# Patient Record
Sex: Female | Born: 1950
Health system: Southern US, Community
[De-identification: ages and names within clinical notes are randomized; demographics above are authoritative.]

## PROBLEM LIST (undated history)

## (undated) DIAGNOSIS — E7800 Pure hypercholesterolemia, unspecified: Secondary | ICD-10-CM

## (undated) DIAGNOSIS — G4730 Sleep apnea, unspecified: Secondary | ICD-10-CM

## (undated) DIAGNOSIS — R1310 Dysphagia, unspecified: Secondary | ICD-10-CM

## (undated) DIAGNOSIS — J309 Allergic rhinitis, unspecified: Secondary | ICD-10-CM

## (undated) DIAGNOSIS — K219 Gastro-esophageal reflux disease without esophagitis: Secondary | ICD-10-CM

## (undated) DIAGNOSIS — D649 Anemia, unspecified: Secondary | ICD-10-CM

## (undated) DIAGNOSIS — M1990 Unspecified osteoarthritis, unspecified site: Secondary | ICD-10-CM

## (undated) DIAGNOSIS — T7840XA Allergy, unspecified, initial encounter: Secondary | ICD-10-CM

## (undated) DIAGNOSIS — H409 Unspecified glaucoma: Secondary | ICD-10-CM

## (undated) DIAGNOSIS — G4700 Insomnia, unspecified: Secondary | ICD-10-CM

## (undated) DIAGNOSIS — H269 Unspecified cataract: Secondary | ICD-10-CM

## (undated) DIAGNOSIS — K589 Irritable bowel syndrome without diarrhea: Secondary | ICD-10-CM

## (undated) DIAGNOSIS — R1013 Epigastric pain: Secondary | ICD-10-CM

## (undated) DIAGNOSIS — R140 Abdominal distension (gaseous): Secondary | ICD-10-CM

## (undated) DIAGNOSIS — G43909 Migraine, unspecified, not intractable, without status migrainosus: Secondary | ICD-10-CM

## (undated) DIAGNOSIS — Z860100 Personal history of colon polyps, unspecified: Secondary | ICD-10-CM

## (undated) DIAGNOSIS — E039 Hypothyroidism, unspecified: Secondary | ICD-10-CM

## (undated) DIAGNOSIS — N182 Chronic kidney disease, stage 2 (mild): Secondary | ICD-10-CM

## (undated) DIAGNOSIS — R634 Abnormal weight loss: Secondary | ICD-10-CM

## (undated) HISTORY — DX: Migraine, unspecified, not intractable, without status migrainosus: G43.909

## (undated) HISTORY — PX: UPPER GASTROINTESTINAL ENDOSCOPY: SHX188

## (undated) HISTORY — DX: Irritable bowel syndrome, unspecified: K58.9

## (undated) HISTORY — DX: Sleep apnea, unspecified: G47.30

## (undated) HISTORY — DX: Chronic kidney disease, stage 2 (mild): N18.2

## (undated) HISTORY — DX: Allergy, unspecified, initial encounter: T78.40XA

## (undated) HISTORY — DX: Anemia, unspecified: D64.9

## (undated) HISTORY — DX: Unspecified osteoarthritis, unspecified site: M19.90

## (undated) HISTORY — DX: Gastro-esophageal reflux disease without esophagitis: K21.9

## (undated) HISTORY — DX: Hypothyroidism, unspecified: E03.9

## (undated) HISTORY — DX: Abdominal distension (gaseous): R14.0

## (undated) HISTORY — PX: CHOLECYSTECTOMY: SHX55

## (undated) HISTORY — DX: Personal history of colon polyps, unspecified: Z86.0100

## (undated) HISTORY — DX: Abnormal weight loss: R63.4

## (undated) HISTORY — DX: Insomnia, unspecified: G47.00

## (undated) HISTORY — PX: POLYPECTOMY: SHX149

## (undated) HISTORY — DX: Unspecified cataract: H26.9

## (undated) HISTORY — DX: Unspecified glaucoma: H40.9

## (undated) HISTORY — DX: Epigastric pain: R10.13

## (undated) HISTORY — DX: Personal history of colonic polyps: Z86.010

## (undated) HISTORY — DX: Allergic rhinitis, unspecified: J30.9

## (undated) HISTORY — DX: Dysphagia, unspecified: R13.10

## (undated) HISTORY — PX: HUMERUS FRACTURE SURGERY: SHX670

## (undated) HISTORY — PX: NASAL SINUS SURGERY: SHX719

## (undated) HISTORY — DX: Pure hypercholesterolemia, unspecified: E78.00

---

## 1990-01-10 HISTORY — PX: CHOLECYSTECTOMY, LAPAROSCOPIC: SHX56

## 1998-05-14 ENCOUNTER — Ambulatory Visit (HOSPITAL_COMMUNITY): Admission: RE | Admit: 1998-05-14 | Discharge: 1998-05-14 | Payer: Self-pay | Admitting: *Deleted

## 1998-05-14 ENCOUNTER — Encounter: Payer: Self-pay | Admitting: *Deleted

## 1998-08-11 HISTORY — PX: TRANSPHENOIDAL PITUITARY RESECTION: SHX2572

## 1999-08-11 ENCOUNTER — Encounter: Payer: Self-pay | Admitting: Unknown Physician Specialty

## 1999-08-11 ENCOUNTER — Ambulatory Visit (HOSPITAL_COMMUNITY): Admission: RE | Admit: 1999-08-11 | Discharge: 1999-08-11 | Payer: Self-pay | Admitting: Unknown Physician Specialty

## 1999-11-11 HISTORY — PX: TMJ ARTHROPLASTY: SHX1066

## 2000-04-03 ENCOUNTER — Ambulatory Visit (HOSPITAL_COMMUNITY): Admission: RE | Admit: 2000-04-03 | Discharge: 2000-04-03 | Payer: Self-pay | Admitting: Neurology

## 2000-04-03 ENCOUNTER — Encounter: Payer: Self-pay | Admitting: Neurology

## 2004-06-25 ENCOUNTER — Ambulatory Visit (HOSPITAL_COMMUNITY): Admission: RE | Admit: 2004-06-25 | Discharge: 2004-06-25 | Payer: Self-pay | Admitting: *Deleted

## 2008-03-20 ENCOUNTER — Ambulatory Visit (HOSPITAL_COMMUNITY): Admission: RE | Admit: 2008-03-20 | Discharge: 2008-03-20 | Payer: Self-pay | Admitting: Psychiatry

## 2010-01-20 HISTORY — PX: COLONOSCOPY: SHX174

## 2010-02-22 ENCOUNTER — Ambulatory Visit (HOSPITAL_COMMUNITY)
Admission: RE | Admit: 2010-02-22 | Discharge: 2010-02-22 | Disposition: A | Discharge status: Home / Self Care | Payer: BC Managed Care – PPO | Source: Ambulatory Visit | Attending: Orthopedic Surgery | Admitting: Orthopedic Surgery

## 2010-02-22 ENCOUNTER — Other Ambulatory Visit (HOSPITAL_COMMUNITY): Payer: Self-pay | Admitting: Orthopedic Surgery

## 2010-02-22 ENCOUNTER — Other Ambulatory Visit (HOSPITAL_COMMUNITY): Payer: Self-pay

## 2010-02-22 ENCOUNTER — Encounter (HOSPITAL_COMMUNITY)
Admission: RE | Admit: 2010-02-22 | Discharge: 2010-02-22 | Disposition: A | Discharge status: Home / Self Care | Payer: BC Managed Care – PPO | Source: Ambulatory Visit | Attending: Orthopedic Surgery | Admitting: Orthopedic Surgery

## 2010-02-22 DIAGNOSIS — X58XXXA Exposure to other specified factors, initial encounter: Secondary | ICD-10-CM | POA: Insufficient documentation

## 2010-02-22 DIAGNOSIS — Z01818 Encounter for other preprocedural examination: Secondary | ICD-10-CM | POA: Insufficient documentation

## 2010-02-22 DIAGNOSIS — S42213A Unspecified displaced fracture of surgical neck of unspecified humerus, initial encounter for closed fracture: Secondary | ICD-10-CM

## 2010-02-22 DIAGNOSIS — S42309A Unspecified fracture of shaft of humerus, unspecified arm, initial encounter for closed fracture: Secondary | ICD-10-CM | POA: Insufficient documentation

## 2010-02-22 LAB — SURGICAL PCR SCREEN: Staphylococcus aureus: POSITIVE — AB

## 2010-02-22 LAB — COMPREHENSIVE METABOLIC PANEL
AST: 42 U/L — ABNORMAL HIGH (ref 0–37)
Albumin: 4 g/dL (ref 3.5–5.2)
Alkaline Phosphatase: 98 U/L (ref 39–117)
CO2: 32 mEq/L (ref 19–32)
Chloride: 99 mEq/L (ref 96–112)
Creatinine, Ser: 0.69 mg/dL (ref 0.4–1.2)
GFR calc Af Amer: >60 mL/min (ref >60)
GFR calc non Af Amer: >60 mL/min (ref >60)
Potassium: 4 mEq/L (ref 3.5–5.1)
Total Bilirubin: 0.3 mg/dL (ref 0.3–1.2)

## 2010-02-22 LAB — PROTIME-INR
INR: 0.88 (ref 0.00–1.49)
Prothrombin Time: 12.1 seconds (ref 11.6–15.2)

## 2010-02-22 LAB — CBC
Hemoglobin: 12.4 g/dL (ref 12.0–15.0)
Platelets: 312 10*3/uL (ref 150–400)
RBC: 4.18 MIL/uL (ref 3.87–5.11)

## 2010-02-22 LAB — URINE MICROSCOPIC-ADD ON

## 2010-02-22 LAB — ABO/RH: ABO/RH(D): A NEG

## 2010-02-22 LAB — URINALYSIS, ROUTINE W REFLEX MICROSCOPIC
Hgb urine dipstick: NEGATIVE
Ketones, ur: NEGATIVE mg/dL
Protein, ur: NEGATIVE mg/dL
Urobilinogen, UA: 0.2 mg/dL (ref 0.0–1.0)

## 2010-02-22 LAB — TYPE AND SCREEN: Antibody Screen: NEGATIVE

## 2010-02-22 LAB — TSH: TSH: 2.228 u[IU]/mL (ref 0.350–4.500)

## 2010-02-22 LAB — DIFFERENTIAL
Basophils Absolute: 0 10*3/uL (ref 0.0–0.1)
Basophils Relative: 0 % (ref 0–1)
Eosinophils Absolute: 0.2 10*3/uL (ref 0.0–0.7)
Neutro Abs: 4.4 10*3/uL (ref 1.7–7.7)
Neutrophils Relative %: 55 % (ref 43–77)

## 2010-02-23 ENCOUNTER — Inpatient Hospital Stay (HOSPITAL_COMMUNITY): Payer: BC Managed Care – PPO

## 2010-02-23 ENCOUNTER — Ambulatory Visit (HOSPITAL_COMMUNITY)
Admission: RE | Admit: 2010-02-23 | Discharge: 2010-02-26 | Disposition: A | Discharge status: Home / Self Care | Payer: BC Managed Care – PPO | Source: Ambulatory Visit | Attending: Orthopedic Surgery | Admitting: Orthopedic Surgery

## 2010-02-23 DIAGNOSIS — E039 Hypothyroidism, unspecified: Secondary | ICD-10-CM | POA: Insufficient documentation

## 2010-02-23 DIAGNOSIS — Z01812 Encounter for preprocedural laboratory examination: Secondary | ICD-10-CM | POA: Insufficient documentation

## 2010-02-23 DIAGNOSIS — Z01810 Encounter for preprocedural cardiovascular examination: Secondary | ICD-10-CM | POA: Insufficient documentation

## 2010-02-23 DIAGNOSIS — Y998 Other external cause status: Secondary | ICD-10-CM | POA: Insufficient documentation

## 2010-02-23 DIAGNOSIS — M129 Arthropathy, unspecified: Secondary | ICD-10-CM | POA: Insufficient documentation

## 2010-02-23 DIAGNOSIS — S42209A Unspecified fracture of upper end of unspecified humerus, initial encounter for closed fracture: Secondary | ICD-10-CM | POA: Insufficient documentation

## 2010-02-23 DIAGNOSIS — Y9229 Other specified public building as the place of occurrence of the external cause: Secondary | ICD-10-CM | POA: Insufficient documentation

## 2010-02-23 DIAGNOSIS — IMO0002 Reserved for concepts with insufficient information to code with codable children: Secondary | ICD-10-CM | POA: Insufficient documentation

## 2010-02-23 DIAGNOSIS — Z23 Encounter for immunization: Secondary | ICD-10-CM | POA: Insufficient documentation

## 2010-02-23 DIAGNOSIS — D649 Anemia, unspecified: Secondary | ICD-10-CM | POA: Insufficient documentation

## 2010-02-23 LAB — VITAMIN D 25 HYDROXY (VIT D DEFICIENCY, FRACTURES): Vit D, 25-Hydroxy: 48 ng/mL (ref 30–89)

## 2010-02-23 LAB — CALCIUM, IONIZED: Calcium, Ion: 1.31 mmol/L (ref 1.12–1.32)

## 2010-02-24 ENCOUNTER — Inpatient Hospital Stay (HOSPITAL_COMMUNITY): Payer: BC Managed Care – PPO

## 2010-02-24 LAB — CBC
HCT: 33.3 % — ABNORMAL LOW (ref 36.0–46.0)
Hemoglobin: 11.3 g/dL — ABNORMAL LOW (ref 12.0–15.0)
MCH: 29.6 pg (ref 26.0–34.0)
MCHC: 33.9 g/dL (ref 30.0–36.0)
MCV: 87.2 fL (ref 78.0–100.0)
RDW: 14 % (ref 11.5–15.5)

## 2010-02-24 LAB — BASIC METABOLIC PANEL
BUN: 9 mg/dL (ref 6–23)
CO2: 28 mEq/L (ref 19–32)
Calcium: 9 mg/dL (ref 8.4–10.5)
Creatinine, Ser: 0.72 mg/dL (ref 0.4–1.2)
GFR calc non Af Amer: >60 mL/min (ref >60)
Glucose, Bld: 141 mg/dL — ABNORMAL HIGH (ref 70–99)
Sodium: 137 mEq/L (ref 135–145)

## 2010-02-25 ENCOUNTER — Other Ambulatory Visit (HOSPITAL_COMMUNITY): Payer: Self-pay

## 2010-02-26 LAB — MISCELLANEOUS TEST
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
## 2010-03-11 NOTE — Discharge Summary (Signed)
  NAME:  Robin Hunter, Robin Hunter               ACCOUNT NO.:  [id number]  MEDICAL RECORD NO.:  [id number]           PATIENT TYPE:  I  LOCATION:  5030                         FACILITY:  [facility]  PHYSICIAN:  Doralee Albino. Carola Frost, M.D. DATE OF BIRTH:  1950-03-04  DATE OF ADMISSION:  02/23/2010 DATE OF DISCHARGE:                              DISCHARGE SUMMARY   ADDENDUM  This is an addendum to job number [id number].  Please see previous discharge summary for full summary.  We did anticipate discharge on February 25, 2010; however, I was somewhat concerned with the patient's high blood pressure as well as a mildly elevated heart rate despite her being completely comfortable.  We did keep her overnight for continued observation.  The patient did not have any apparent issues overnight and continued to remain stable following morning.  DISCHARGE VITAL SIGNS:  On February 26, 2010, temperature 98.3, heart rate 112, BP 144/82, respirations 18 at 97% on room air.  The patient denied any chest pain.  No shortness of breath.  No palpitations.  No visual changes.  The patient does report recent dealings with headaches for which [facility] [place].  She thinks that these are really not like any migraine headaches that she has in the past.  I do feel that these may be related to some undiagnosed hypertension.  I did review the patient's perioperative record again, which showed elevated heart rate as well as elevated blood pressures with systolic BP from 324M-010U and diastolic ranging from 70s-90s.  Heart rate also fairly sustained in the high 90s to low 100s.  Again, the patient does appear to be asymptomatic in terms of any type of cardiac ischemia.  She does not have any chest pain or palpitations.  She is likely dealing this for some time and I feel that she would be best evaluated by her PCP for possible treatment for her hypertension, which may include some beta-blockers, and this may  address her headaches as well.  From an Orthopedic standpoint, she is stable for discharge to home with followup with [place] in 10-14 days, and I would like her to follow up with her primary care physician within 1-2 weeks. Again, please see previous note for full summary.  No changes to medications will be made as well.     Mearl Latin, PA   ______________________________ Doralee Albino. Carola Frost, M.D.   KWP/MEDQ  D:  02/26/2010  T:  02/26/2010  Job:  [id number]  cc:   Dr. Welton Flakes  Electronically Signed by Montez Morita PA on 02/26/2010 12:08:59 PM Electronically Signed by Myrene Galas M.D. on 03/11/2010 07:53:52 AM

## 2011-05-14 IMAGING — RF DG HUMERUS 2V *L*
1 series · 8 of 8 positions shown · non-contrast
Comparison: 02/22/2010 chest radiograph

CLINICAL DATA: Internal fixation of left humeral fracture.

LEFT HUMERUS - 2+ VIEW

[Series 1: run · 8 of 8 slices shown]
[im 1/8]
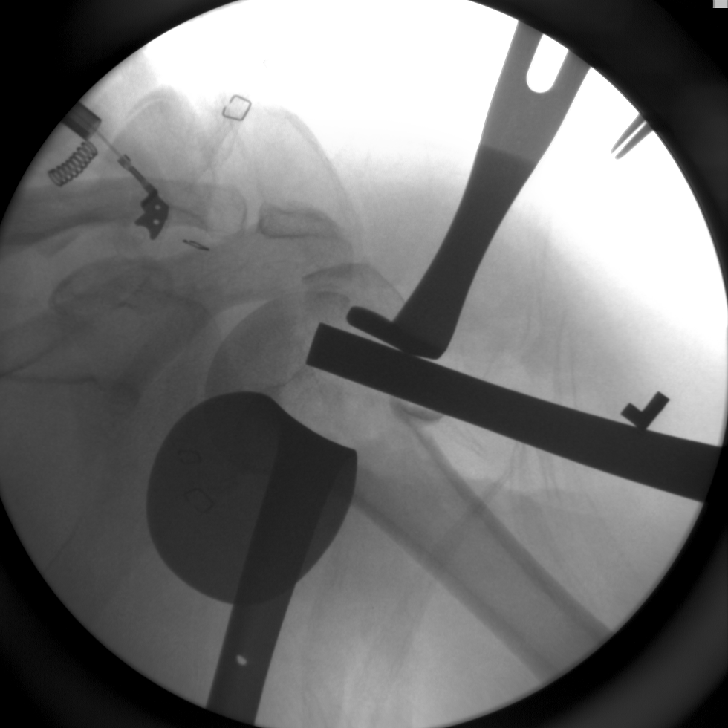
[im 2/8]
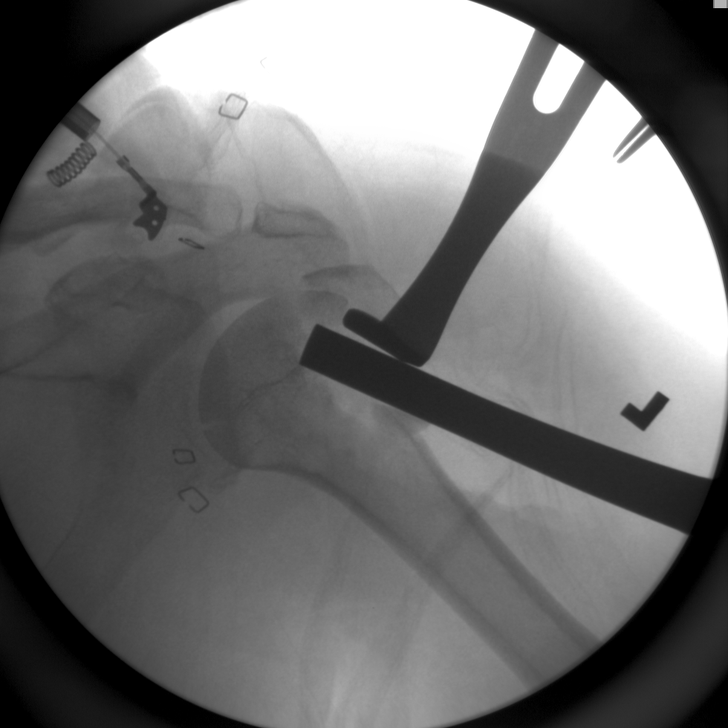
[im 3/8]
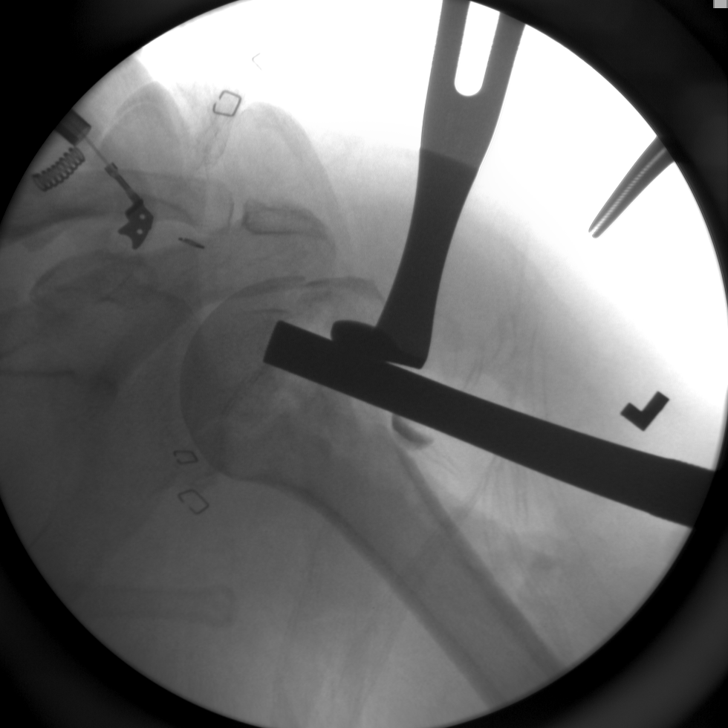
[im 4/8]
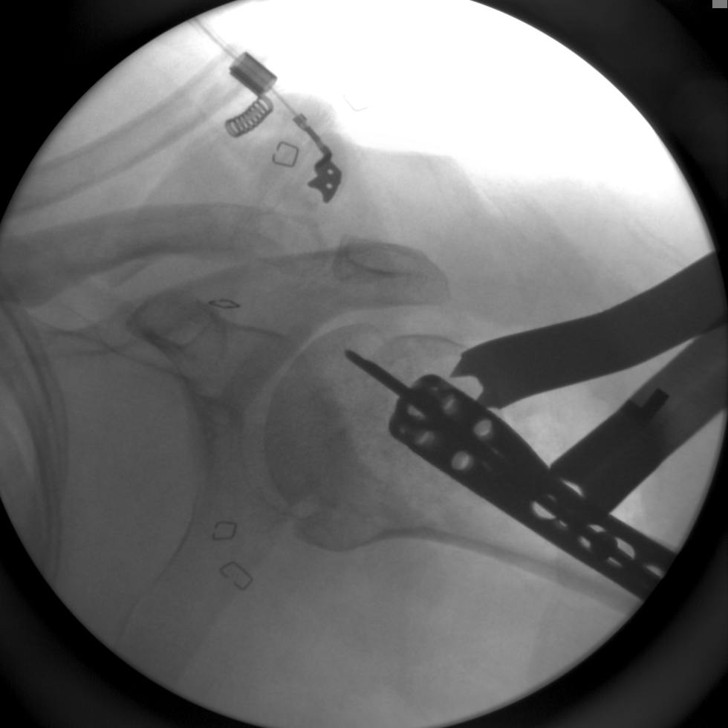
[im 5/8]
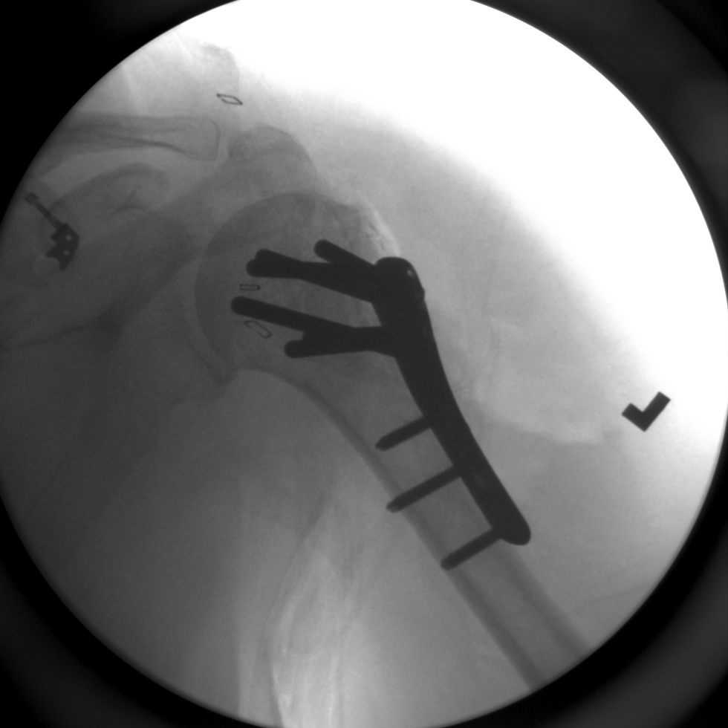
[im 6/8]
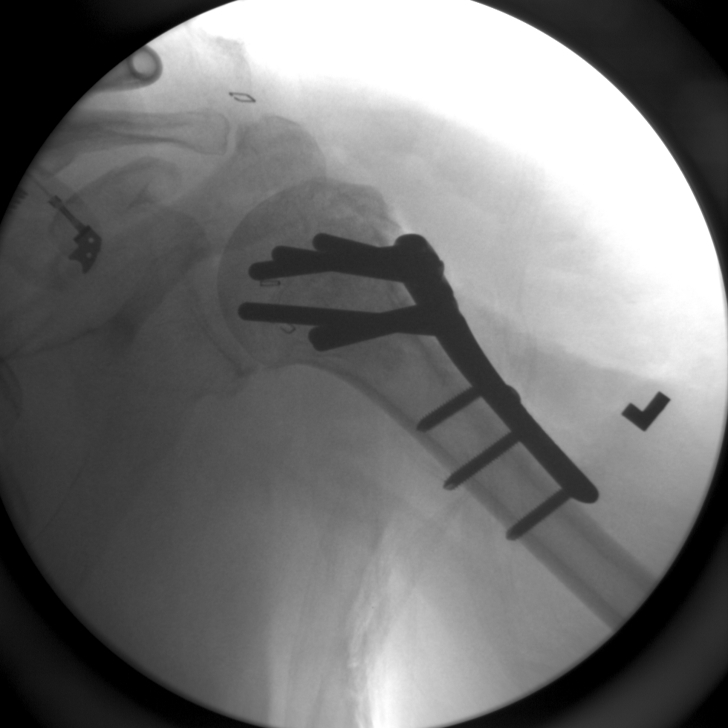
[im 7/8]
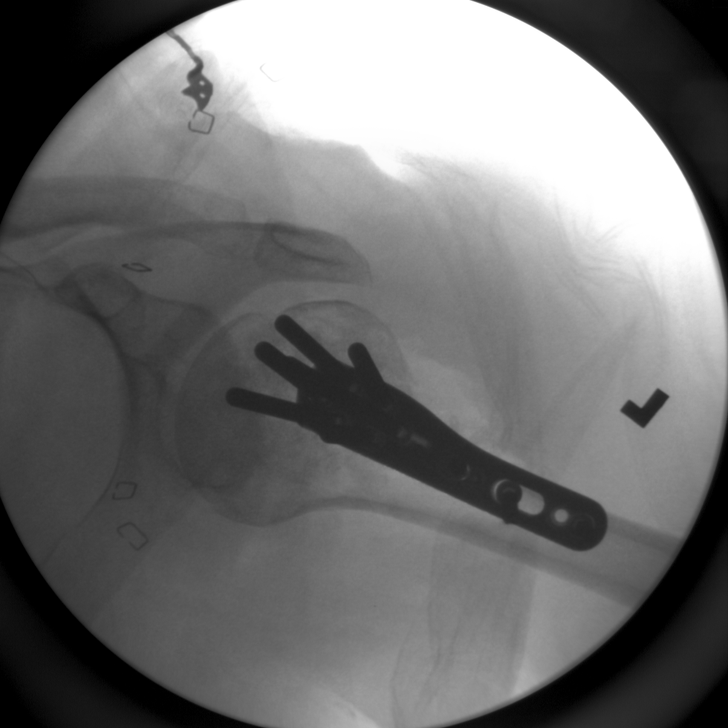
[im 8/8]
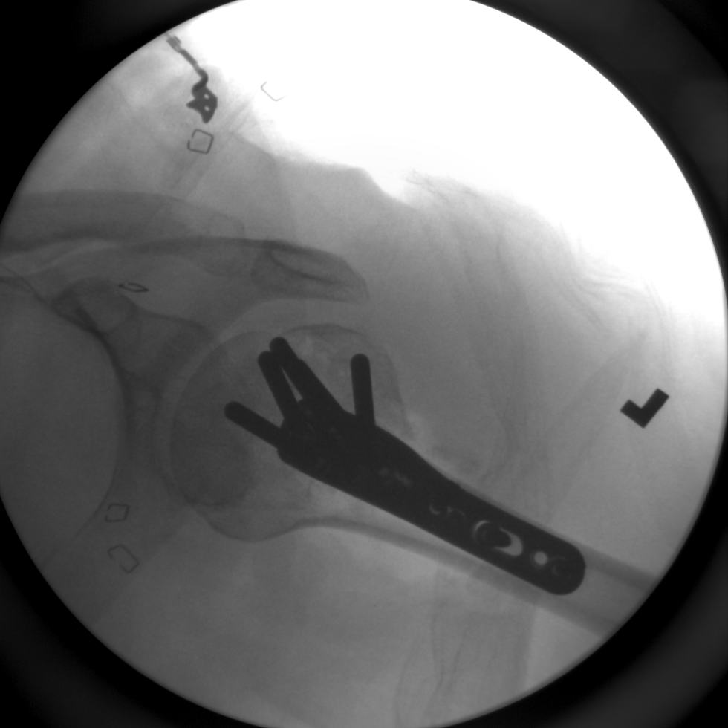

[8 of 8 positions shown; findings below may reference images not displayed]

FINDINGS: Multiple intraoperative spot views of the proximal left
humerus are submitted postoperatively for interpretation.

Multiple images demonstrate several instruments overlying the left
humerus with subsequent internal plate screw fixation of a humeral
head/neck fracture.  The fracture appears to be in near anatomic
alignment and position.
IMPRESSION: Internal fixation of proximal humeral fracture without gross
complicating features.

## 2013-09-18 HISTORY — PX: UPPER GI ENDOSCOPY: SHX6162

## 2015-09-23 HISTORY — PX: COLONOSCOPY: SHX174

## 2016-09-10 HISTORY — PX: COLONOSCOPY: SHX174

## 2017-04-17 ENCOUNTER — Telehealth: Payer: Self-pay | Admitting: Gastroenterology

## 2017-04-17 NOTE — Telephone Encounter (Signed)
Spoke with her by phone.  She is having diarrhea/soft stools on and off for the past month.  States she does not routinely experience diarrhea.  She is trying diet modification, increase fiber.  Will let us know if she does not improve over the next couple of days.

## 2017-04-17 NOTE — Telephone Encounter (Signed)
Patient states she is having a lot of diarrhea and trouble eating. Patient scheduled for first opening 5.7.19; with Dr.Gupta due to her stating she has seen him in the last year. Patient wanting to know what she can do in the meantime.

## 2017-04-18 ENCOUNTER — Telehealth: Payer: Self-pay | Admitting: Gastroenterology

## 2017-04-18 NOTE — Telephone Encounter (Signed)
Lets try cholestyramine powder 4g po bid 2 hrs before or after other meds For 2 weeks. Patient to call us if still with problems.

## 2017-04-18 NOTE — Telephone Encounter (Signed)
Spoke with patient and she continues to experience diarrhea.  She relates that you instructed her not to increase the fiber in her diet as this leads to bloating.  She is taking Immodium with limited improvement in symptoms.  Has an appt with you the beginning of May.  Please advise, thank you.

## 2017-04-19 ENCOUNTER — Other Ambulatory Visit: Payer: Self-pay

## 2017-04-19 DIAGNOSIS — R197 Diarrhea, unspecified: Secondary | ICD-10-CM

## 2017-04-19 MED ORDER — CHOLESTYRAMINE 4 G PO PACK: 4.0000 g | PACK | Freq: Two times a day (BID) | ORAL | 1 refills | Status: DC

## 2017-04-19 NOTE — Telephone Encounter (Signed)
Patient aware that a new medication has been sent to her pharmacy.  She will try for 2 weeks and then report back.

## 2017-05-16 ENCOUNTER — Encounter: Payer: Self-pay | Admitting: Gastroenterology

## 2017-05-16 ENCOUNTER — Ambulatory Visit (INDEPENDENT_AMBULATORY_CARE_PROVIDER_SITE_OTHER): Payer: Medicare Other | Admitting: Gastroenterology

## 2017-05-16 ENCOUNTER — Encounter

## 2017-05-16 ENCOUNTER — Other Ambulatory Visit (INDEPENDENT_AMBULATORY_CARE_PROVIDER_SITE_OTHER): Payer: Medicare Other

## 2017-05-16 VITALS — BP 134/70 | HR 75 | Ht 63.0 in | Wt 123.0 lb

## 2017-05-16 DIAGNOSIS — R634 Abnormal weight loss: Secondary | ICD-10-CM | POA: Diagnosis not present

## 2017-05-16 DIAGNOSIS — R197 Diarrhea, unspecified: Secondary | ICD-10-CM

## 2017-05-16 NOTE — Patient Instructions (Signed)
If you are age 69 or older, your body mass index should be between 23-30. Your Body mass index is 21.79 kg/m. If this is out of the aforementioned range listed, please consider follow up with your Primary Care Provider.  If you are age 18 or younger, your body mass index should be between 19-25. Your Body mass index is 21.79 kg/m. If this is out of the aformentioned range listed, please consider follow up with your Primary Care Provider.   Please stop at the lab before you leave the office today.   Check wieight every week and write it down, please bring this to your next appointment.   Thank you,  Dr. Lynann Bologna

## 2017-05-16 NOTE — Progress Notes (Signed)
Chief Complaint: H/O Diarrhea  Referring Provider:  Dr Debbe Odea      ASSESSMENT AND PLAN;   #1. Diarrhea - better.  #2. Weight Loss (30lb over last 1 year). Thyroid meds being adjusted. Neg CT 03/2016 and colon 09/2015 except for small hyperplastic polyps. Neg EGD 09/18/2013 except for small hiatal hernia, Schatzki's ring status post esophageal dilatation.  - GI pathogen and stool for C. Diff. - Check weight every week and bring recordings to the next visit.  Patient is to report only if there is continued weight loss. If continued weight loss, will proceed with CT scan of the abdomen and pelvis with p.o. and IV contrast. - If still with problems, proceed with rpt colonoscopy. - Continue cholestryamine 4 g qd prn. - Stop reglan and any magnesium supplements. - Continue activia. - Check CBC and CMP. - Await records de-conversion   HPI:    64yr With diarrhea since March 2019 after eating out, resolved with cholestramine Has lost 30 pounds over the last 1 year.  She is having problems with TSH and a thyroid medications are being adjusted. She has good appetite. No nausea, vomiting, heartburn, regurgitation, odynophagia or dysphagia.  No significant constipation.  There is no melena or hematochezia.   Past GI procedures/history: -CT scan abdomen pelvis 03/25/2016-negative for acute abnormalities. -Colonoscopy 09/23/2015 small hyperplastic colonic polyps, mild sigmoid diverticulosis, otherwise normal colonoscopy.  Highly redundant colon.  Colonoscopy January 2012-mild sigmoid diverticulosis, small hyperplastic colonic polyps. -EGD 09/18/2013: Schatzki's ring status post esophageal dilatation, small hiatal hernia  Past Medical History:  Diagnosis Date  . Abdominal distension   . Allergic rhinitis   . CRI (chronic renal insufficiency), stage 2 (mild)   . Dysphagia   . Epigastric pain   . GERD (gastroesophageal reflux disease)   . History of colon polyps   . Hypercholesteremia   .  Hypothyroidism   . IBS (irritable colon syndrome)    with Constipation  . Insomnia   . Migraine   . Osteoarthritis   . Weight loss     Past Surgical History:  Procedure Laterality Date  . CHOLECYSTECTOMY, LAPAROSCOPIC  1992  . COLONOSCOPY  09/2016   polyps  . COLONOSCOPY  09/23/2015   small colonic polyp. mild sigmoid diverticulosis. BX: hyperplastic polyp  . COLONOSCOPY  01/20/2010   small colonic polyp, mild sigmoid diverticulosis, small internal hemorrhoids. BX: negative  . HUMERUS FRACTURE SURGERY     stainless steel rod placed  . NASAL SINUS SURGERY    . TMJ ARTHROPLASTY  11/1999  . TRANSPHENOIDAL PITUITARY RESECTION  08/1998  . UPPER GI ENDOSCOPY  09/18/2013   schatzki ring . Small Hiatal Hernia. Torturous esophagus suggestive of esophageal dysmotility. BX: benign squamous epithelim with focal basal cell hyperplasia and rare intraepitheal eosinophillis.    Family History  Problem Relation Age of Onset  . Diabetes Mother   . Diabetes Sister   . Irritable bowel syndrome Sister   . Breast cancer Maternal Aunt     Social History   Tobacco Use  . Smoking status: Never Smoker  . Smokeless tobacco: Never Used  Substance Use Topics  . Alcohol use: Not Currently  . Drug use: Never    Current Outpatient Medications  Medication Sig Dispense Refill  . celecoxib (CELEBREX) 200 MG capsule Take 200 mg by mouth daily.    . chlorzoxazone (PARAFON FORTE) 250 MG tablet Take 250 mg by mouth at bedtime.    . fenofibrate micronized (LOFIBRA) 134 MG capsule  Take 134 mg by mouth daily before breakfast.    . gabapentin (NEURONTIN) 600 MG tablet Take 600 mg by mouth 3 (three) times daily.    . Galcanezumab-gnlm (EMGALITY) 120 MG/ML SOAJ Inject 120 mg into the skin every 30 (thirty) days.    Marland Kitchen imipramine (TOFRANIL) 50 MG tablet Take 50 mg by mouth at bedtime.    Marland Kitchen levothyroxine (SYNTHROID, LEVOTHROID) 88 MCG tablet Take 88 mcg by mouth daily before breakfast.    . metoCLOPramide  (REGLAN) 10 MG tablet Take 10 mg by mouth every 6 (six) hours as needed for nausea.    . OnabotulinumtoxinA (BOTOX IJ) Inject as directed every 3 (three) months.    . pantoprazole (PROTONIX) 40 MG tablet Take 40 mg by mouth daily.    . cholestyramine (QUESTRAN) 4 g packet Take 1 packet (4 g total) by mouth 2 (two) times daily. (Patient not taking: Reported on 05/16/2017) 60 each 1   No current facility-administered medications for this visit.     Not on File  Review of Systems:  Constitutional: Denies fever, chills, diaphoresis, appetite change and fatigue.  HEENT: Denies photophobia, eye pain, redness, hearing loss, ear pain, congestion, sore throat, rhinorrhea, sneezing, mouth sores, neck pain, neck stiffness and tinnitus. Has allergies and sinus trouble. Respiratory: Denies SOB, DOE, cough, chest tightness,  and wheezing.   Cardiovascular: Denies chest pain, palpitations and leg swelling.  Genitourinary: Denies dysuria, urgency, frequency, hematuria, flank pain and difficulty urinating.  Musculoskeletal: Denies myalgias, back pain, joint swelling, arthralgias and gait problem.  Skin: No rash.  Neurological: Denies dizziness, seizures, syncope, weakness, light-headedness, numbness. Has headaches. Uses hearing aids. Hematological: Denies adenopathy. Easy bruising, personal or family bleeding history  Psychiatric/Behavioral: No anxiety or depression     Physical Exam:    BP 134/70   Pulse 75   Ht  (1.6 m)   Wt 123 lb (55.8 kg)   BMI 21.79 kg/m  Filed Weights   05/16/17 1354  Weight: 123 lb (55.8 kg)   Constitutional:  Well-developed, in no acute distress. Psychiatric: Normal mood and affect. Behavior is normal. HEENT: Pupils normal.  Conjunctivae are normal. No scleral icterus. Neck supple.  Cardiovascular: Normal rate, regular rhythm. No edema Pulmonary/chest: Effort normal and breath sounds normal. No wheezing, rales or rhonchi. Abdominal: Soft, nondistended. Nontender.  Bowel sounds active throughout. There are no masses palpable. No hepatomegaly. Rectal:  defered Neurological: Alert and oriented to person place and time. Skin: Skin is warm and dry. No rashes noted.    Edman Circle, MD   Cc: Dr Welton Flakes

## 2017-05-17 ENCOUNTER — Other Ambulatory Visit (INDEPENDENT_AMBULATORY_CARE_PROVIDER_SITE_OTHER): Payer: Medicare Other

## 2017-05-17 DIAGNOSIS — R634 Abnormal weight loss: Secondary | ICD-10-CM

## 2017-05-17 DIAGNOSIS — R197 Diarrhea, unspecified: Secondary | ICD-10-CM | POA: Diagnosis not present

## 2017-05-17 LAB — CBC WITH DIFFERENTIAL/PLATELET
BASOS PCT: 0.9 % (ref 0.0–3.0)
Basophils Absolute: 0.1 10*3/uL (ref 0.0–0.1)
EOS ABS: 0.2 10*3/uL (ref 0.0–0.7)
EOS PCT: 2.9 % (ref 0.0–5.0)
HEMATOCRIT: 34.1 % — AB (ref 36.0–46.0)
Hemoglobin: 11.5 g/dL — ABNORMAL LOW (ref 12.0–15.0)
LYMPHS PCT: 38.5 % (ref 12.0–46.0)
Lymphs Abs: 2.5 10*3/uL (ref 0.7–4.0)
MCHC: 33.8 g/dL (ref 30.0–36.0)
MCV: 90.6 fl (ref 78.0–100.0)
Monocytes Absolute: 0.6 10*3/uL (ref 0.1–1.0)
Monocytes Relative: 10 % (ref 3.0–12.0)
NEUTROS ABS: 3.1 10*3/uL (ref 1.4–7.7)
NEUTROS PCT: 47.7 % (ref 43.0–77.0)
PLATELETS: 315 10*3/uL (ref 150.0–400.0)
RBC: 3.77 Mil/uL — ABNORMAL LOW (ref 3.87–5.11)
RDW: 13.3 % (ref 11.5–15.5)
WBC: 6.5 10*3/uL (ref 4.0–10.5)

## 2017-05-17 LAB — COMPREHENSIVE METABOLIC PANEL
ALT: 25 U/L (ref 0–35)
AST: 22 U/L (ref 0–37)
Albumin: 4.2 g/dL (ref 3.5–5.2)
Alkaline Phosphatase: 44 U/L (ref 39–117)
BUN: 14 mg/dL (ref 6–23)
CALCIUM: 10.1 mg/dL (ref 8.4–10.5)
CHLORIDE: 95 meq/L — AB (ref 96–112)
CO2: 34 mEq/L — ABNORMAL HIGH (ref 19–32)
CREATININE: 0.8 mg/dL (ref 0.40–1.20)
GFR: 75.68 mL/min (ref >60.00)
Glucose, Bld: 96 mg/dL (ref 70–99)
POTASSIUM: 5.1 meq/L (ref 3.5–5.1)
Sodium: 133 mEq/L — ABNORMAL LOW (ref 135–145)
Total Bilirubin: 0.2 mg/dL (ref 0.2–1.2)
Total Protein: 6.6 g/dL (ref 6.0–8.3)

## 2017-05-18 ENCOUNTER — Telehealth: Payer: Self-pay

## 2017-05-18 NOTE — Telephone Encounter (Signed)
Called patient with lab results.  No PCP listed in chart, copy of labs mailed to patient.

## 2017-05-19 ENCOUNTER — Other Ambulatory Visit: Payer: Medicare Other

## 2017-05-19 DIAGNOSIS — R197 Diarrhea, unspecified: Secondary | ICD-10-CM

## 2017-05-19 DIAGNOSIS — R634 Abnormal weight loss: Secondary | ICD-10-CM

## 2017-05-22 ENCOUNTER — Telehealth: Payer: Self-pay

## 2017-05-22 LAB — GASTROINTESTINAL PATHOGEN PANEL PCR
C. difficile Tox A/B, PCR: NOT DETECTED
Campylobacter, PCR: NOT DETECTED
Cryptosporidium, PCR: NOT DETECTED
E COLI (ETEC) LT/ST, PCR: NOT DETECTED
E COLI (STEC) STX1/STX2, PCR: NOT DETECTED
E COLI 0157, PCR: NOT DETECTED
GIARDIA LAMBLIA, PCR: NOT DETECTED
Norovirus, PCR: NOT DETECTED
Rotavirus A, PCR: NOT DETECTED
SHIGELLA, PCR: NOT DETECTED
Salmonella, PCR: NOT DETECTED

## 2017-05-22 LAB — CLOSTRIDIUM DIFFICILE TOXIN B, QUALITATIVE, REAL-TIME PCR: Toxigenic C. Difficile by PCR: NOT DETECTED

## 2017-05-22 LAB — FECAL LACTOFERRIN, QUANT
FECAL LACTOFERRIN: NEGATIVE
MICRO NUMBER:: 90575217
SPECIMEN QUALITY: ADEQUATE

## 2017-05-22 NOTE — Telephone Encounter (Signed)
I left her a message that all stool tests were negative.

## 2017-05-22 NOTE — Telephone Encounter (Signed)
Called with test results, c.diff is negative.

## 2017-05-25 NOTE — Telephone Encounter (Signed)
Patient would like results from stool test mailed to her.

## 2017-08-08 ENCOUNTER — Ambulatory Visit: Payer: Medicare Other | Admitting: Gastroenterology

## 2017-09-29 ENCOUNTER — Ambulatory Visit (INDEPENDENT_AMBULATORY_CARE_PROVIDER_SITE_OTHER): Payer: Medicare Other | Admitting: Gastroenterology

## 2017-09-29 ENCOUNTER — Encounter: Payer: Self-pay | Admitting: Gastroenterology

## 2017-09-29 VITALS — BP 116/72 | HR 68 | Ht 63.0 in | Wt 123.0 lb

## 2017-09-29 DIAGNOSIS — K219 Gastro-esophageal reflux disease without esophagitis: Secondary | ICD-10-CM | POA: Diagnosis not present

## 2017-09-29 DIAGNOSIS — R05 Cough: Secondary | ICD-10-CM

## 2017-09-29 DIAGNOSIS — R059 Cough, unspecified: Secondary | ICD-10-CM

## 2017-09-29 NOTE — Progress Notes (Signed)
IMPRESSION and PLAN:   #1. Diarrhea - resolved.  #2. Weight Loss (30lb over last 1 year)- resolved. Thyroid meds being adjusted. Neg CT 03/2016 and colon 09/2015 except for small hyperplastic polyps. Neg EGD 09/18/2013 except for small hiatal hernia, Schatzki's ring status post esophageal dilatation.  #3. Cough (has been on anti-allergy meds, neg chest Xray)  Plan: -  Increase protonix 40mg  po bid. Add pepcid 20mg  po qhs. -  Pt has appt with Dr Marcheta GrammesMa for hoarseness. -  She wants to hold off on CT scan of the abdomen and pelvis with p.o. and IV contrast as the wt loss has resolved. - Continue cholestryamine 4 g qd prn. - Continue activia. - If any problems with dysphagia or if ENT eval is negative, proceed with Ba Swallow followed by EGD if needed.      HPI:    Chief Complaint:   Robin Hunter is a 67 y.o. female  With cough, postprandial No odynophagia or dysphagia Had a negative chest x-ray Did have a viral pharyngitis prior to starting cough.-Seems more like post viral cough. Does complain of some heartburn.  Protonix has been increased to twice daily. Seen by Dr Welton FlakesKhan and has appt with Dr Marcheta GrammesMa    Past Medical History:  Diagnosis Date  . Abdominal distension   . Allergic rhinitis   . CRI (chronic renal insufficiency), stage 2 (mild)   . Dysphagia   . Epigastric pain   . GERD (gastroesophageal reflux disease)   . History of colon polyps   . Hypercholesteremia   . Hypothyroidism   . IBS (irritable colon syndrome)    with Constipation  . Insomnia   . Migraine   . Osteoarthritis   . Weight loss     Current Outpatient Medications  Medication Sig Dispense Refill  . celecoxib (CELEBREX) 200 MG capsule Take 200 mg by mouth daily.    . chlorzoxazone (PARAFON FORTE) 250 MG tablet Take 250 mg by mouth at bedtime.    . cholestyramine (QUESTRAN) 4 g packet Take 1 packet (4 g total) by mouth 2 (two) times daily. 60 each 1  . fenofibrate micronized (LOFIBRA) 134 MG  capsule Take 134 mg by mouth daily before breakfast.    . gabapentin (NEURONTIN) 600 MG tablet Take 600 mg by mouth 3 (three) times daily.    . Galcanezumab-gnlm (EMGALITY) 120 MG/ML SOAJ Inject 120 mg into the skin every 30 (thirty) days.    Marland Kitchen. imipramine (TOFRANIL) 50 MG tablet Take 50 mg by mouth at bedtime.    Marland Kitchen. levothyroxine (SYNTHROID, LEVOTHROID) 88 MCG tablet Take 88 mcg by mouth daily before breakfast.    . metoCLOPramide (REGLAN) 10 MG tablet Take 10 mg by mouth every 6 (six) hours as needed for nausea.    . OnabotulinumtoxinA (BOTOX IJ) Inject as directed every 3 (three) months.    . pantoprazole (PROTONIX) 40 MG tablet Take 40 mg by mouth daily.     No current facility-administered medications for this visit.     Past Surgical History:  Procedure Laterality Date  . CHOLECYSTECTOMY, LAPAROSCOPIC  1992  . COLONOSCOPY  09/2016   polyps  . COLONOSCOPY  09/23/2015   small colonic polyp. mild sigmoid diverticulosis. BX: hyperplastic polyp  . COLONOSCOPY  01/20/2010   small colonic polyp, mild sigmoid diverticulosis, small internal hemorrhoids. BX: negative  . HUMERUS FRACTURE SURGERY     stainless steel rod placed  . NASAL SINUS SURGERY    . TMJ  ARTHROPLASTY  11/1999  . TRANSPHENOIDAL PITUITARY RESECTION  08/1998  . UPPER GI ENDOSCOPY  09/18/2013   schatzki ring . Small Hiatal Hernia. Torturous esophagus suggestive of esophageal dysmotility. BX: benign squamous epithelim with focal basal cell hyperplasia and rare intraepitheal eosinophillis.    Family History  Problem Relation Age of Onset  . Diabetes Mother   . Diabetes Sister   . Irritable bowel syndrome Sister   . Breast cancer Maternal Aunt     Social History   Tobacco Use  . Smoking status: Never Smoker  . Smokeless tobacco: Never Used  Substance Use Topics  . Alcohol use: Not Currently  . Drug use: Never    Not on File   Review of Systems: All systems reviewed and negative except where noted in HPI.     Physical Exam:     BP 116/72   Pulse 68   Ht 5\' 3"  (1.6 m)   Wt 123 lb (55.8 kg)   BMI 21.79 kg/m  @WEIGHTLAST3 @ GENERAL:  Alert, oriented, cooperative, not in acute distress. PSYCH: :Pleasant, normal mood and affect. HEENT:  conjunctiva pink, mucous membranes moist, neck supple without masses. No jaundice. CARDIAC:  S1 S2 normal. No murmers. PULM: Normal respiratory effort, lungs CTA bilaterally, no wheezing. ABDOMEN: Inspection: No visible peristalsis, no abnormal pulsations, skin normal.  Palpation/percussion: Soft, nontender, nondistended, no rigidity, no abnormal dullness to percussion, no hepatosplenomegaly and no palpable abdominal masses.  Auscultation: Normal bowel sounds, no abdominal bruits. Rectal exam: Deferred SKIN:  turgor, no lesions seen. Musculoskeletal:  Normal muscle tone, normal strength. NEURO: Alert and oriented x 3, no focal neurologic deficits: No results found. I spent 15 minutes of face-to-face time with the patient. Greater than 50% of the time was spent counseling and coordinating care.    [name],MD 09/29/2017, 2:03 PM   CC Dr Welton Flakes

## 2017-09-29 NOTE — Patient Instructions (Signed)
If you are age 69 or older, your body mass index should be between 23-30. Your Body mass index is 21.79 kg/m. If this is out of the aforementioned range listed, please consider follow up with your Primary Care Provider.  If you are age 69 or younger, your body mass index should be between 19-25. Your Body mass index is 21.79 kg/m. If this is out of the aformentioned range listed, please consider follow up with your Primary Care Provider.   We have sent the following medications to your pharmacy for you to pick up at your convenience: Protonix twice daily.   Please purchase the following medications over the counter and take as directed: Pepcid 20 mg at bedtime.  Thank you,  Dr. Lynann Bolognaajesh Gupta

## 2018-01-26 ENCOUNTER — Encounter: Payer: Self-pay | Admitting: Gastroenterology

## 2018-01-26 ENCOUNTER — Ambulatory Visit: Payer: Medicare Other | Admitting: Gastroenterology

## 2018-01-26 VITALS — BP 112/72 | HR 64 | Ht 63.0 in | Wt 124.1 lb

## 2018-01-26 DIAGNOSIS — R05 Cough: Secondary | ICD-10-CM | POA: Diagnosis not present

## 2018-01-26 DIAGNOSIS — R634 Abnormal weight loss: Secondary | ICD-10-CM | POA: Diagnosis not present

## 2018-01-26 DIAGNOSIS — R059 Cough, unspecified: Secondary | ICD-10-CM

## 2018-01-26 DIAGNOSIS — R197 Diarrhea, unspecified: Secondary | ICD-10-CM | POA: Diagnosis not present

## 2018-01-26 MED ORDER — PANTOPRAZOLE SODIUM 40 MG PO TBEC: 40.0000 mg | DELAYED_RELEASE_TABLET | Freq: Two times a day (BID) | ORAL | 6 refills | Status: DC

## 2018-01-26 MED ORDER — FAMOTIDINE 20 MG PO TABS: 20.0000 mg | ORAL_TABLET | Freq: Every day | ORAL | 11 refills | Status: DC

## 2018-01-26 NOTE — Progress Notes (Signed)
IMPRESSION and PLAN:   #1. Diarrhea - resolved. But restarted after antibiotics (for acute sinusitis) with lower abdominal pain.  Rule out C. Difficile.  #2. Weight Loss (30lb over last 1 year)- resolved. Thyroid meds being adjusted. Neg CT 03/2016 and colon 09/2015 except for small hyperplastic polyps. Neg EGD 09/18/2013 except for small hiatal hernia, Schatzki's ring status post esophageal dilatation.  #3. Cough (resolved) (has been on anti-allergy meds, neg chest Xray). Had recent antibiotics.  Plan: - GI pathogen, WBC. - Protonix 40mg  po bid. Add pepcid 20mg  po qhs. - CT scan of the abdomen and pelvis with p.o. and IV contrast. - Continue cholestryamine 4 g qd prn. - Continue activia. - Minimize reglan (as it can cause diarrhea and extrapyramidal side effects) - She will continue to follow-up with Dr. Marcheta GrammesMa (ENT). - Follow-up after the above is complete.      HPI:    Chief Complaint:   Robin Hunter is a 68 y.o. female  For follow-up visit Has been doing very well previously.  Her diarrhea had almost completely resolved on cholestyramine.  She has been using it only once a day as needed.  She had gained 4 pounds.  About 2 to 3 weeks ago, had acute sinusitis.  Initially treated with amoxicillin unsuccessfully then finally treated with Avelox.  The diarrhea did get worse and is currently at the frequency of 2-3 times per day.  She denies having any nocturnal symptoms.  No melena or hematochezia  Does complain of lower abdominal crampy pain.  Has lost more weight.  Of note that the cough is completely resolved.   Filed Weights   01/26/18 1611  Weight: 124 lb 2 oz (56.3 kg)    Past Medical History:  Diagnosis Date  . Abdominal distension   . Allergic rhinitis   . CRI (chronic renal insufficiency), stage 2 (mild)   . Dysphagia   . Epigastric pain   . GERD (gastroesophageal reflux disease)   . History of colon polyps   . Hypercholesteremia   . Hypothyroidism   .  IBS (irritable colon syndrome)    with Constipation  . Insomnia   . Migraine   . Osteoarthritis   . Weight loss     Current Outpatient Medications  Medication Sig Dispense Refill  . celecoxib (CELEBREX) 200 MG capsule Take 200 mg by mouth daily.    . chlorzoxazone (PARAFON FORTE) 250 MG tablet Take 250 mg by mouth at bedtime.    . cholestyramine (QUESTRAN) 4 g packet Take 1 packet (4 g total) by mouth 2 (two) times daily. 60 each 1  . fenofibrate micronized (LOFIBRA) 134 MG capsule Take 134 mg by mouth daily before breakfast.    . gabapentin (NEURONTIN) 600 MG tablet Take 600 mg by mouth 3 (three) times daily.    . Galcanezumab-gnlm (EMGALITY) 120 MG/ML SOAJ Inject 120 mg into the skin every 30 (thirty) days.    Marland Kitchen. imipramine (TOFRANIL) 50 MG tablet Take 50 mg by mouth at bedtime.    Marland Kitchen. levothyroxine (SYNTHROID, LEVOTHROID) 88 MCG tablet Take 88 mcg by mouth daily before breakfast.    . metoCLOPramide (REGLAN) 10 MG tablet Take 10 mg by mouth every 6 (six) hours as needed for nausea.    . OnabotulinumtoxinA (BOTOX IJ) Inject as directed every 3 (three) months.    . pantoprazole (PROTONIX) 40 MG tablet Take 40 mg by mouth daily.     No current facility-administered medications for this visit.  Past Surgical History:  Procedure Laterality Date  . CHOLECYSTECTOMY, LAPAROSCOPIC  1992  . COLONOSCOPY  09/2016   polyps  . COLONOSCOPY  09/23/2015   small colonic polyp. mild sigmoid diverticulosis. BX: hyperplastic polyp  . COLONOSCOPY  01/20/2010   small colonic polyp, mild sigmoid diverticulosis, small internal hemorrhoids. BX: negative  . HUMERUS FRACTURE SURGERY     stainless steel rod placed  . NASAL SINUS SURGERY    . TMJ ARTHROPLASTY  11/1999  . TRANSPHENOIDAL PITUITARY RESECTION  08/1998  . UPPER GI ENDOSCOPY  09/18/2013   schatzki ring . Small Hiatal Hernia. Torturous esophagus suggestive of esophageal dysmotility. BX: benign squamous epithelim with focal basal cell  hyperplasia and rare intraepitheal eosinophillis.    Family History  Problem Relation Age of Onset  . Diabetes Mother   . Diabetes Sister   . Irritable bowel syndrome Sister   . Breast cancer Maternal Aunt   . Colon cancer Neg Hx   . Esophageal cancer Neg Hx     Social History   Tobacco Use  . Smoking status: Never Smoker  . Smokeless tobacco: Never Used  Substance Use Topics  . Alcohol use: Not Currently  . Drug use: Never    Allergies  Allergen Reactions  . Levofloxacin     headache     Review of Systems: All systems reviewed and negative except where noted in HPI.    Physical Exam:     BP 112/72   Pulse 64   Ht 5\' 3"  (1.6 m)   Wt 124 lb 2 oz (56.3 kg)   BMI 21.99 kg/m  @WEIGHTLAST3 @ GENERAL:  Alert, oriented, cooperative, not in acute distress. PSYCH: :Pleasant, normal mood and affect. HEENT:  conjunctiva pink, mucous membranes moist, neck supple without masses. No jaundice. CARDIAC:  S1 S2 normal. No murmers. PULM: Normal respiratory effort, lungs CTA bilaterally, no wheezing. ABDOMEN: Inspection: No visible peristalsis, no abnormal pulsations, skin normal.  Palpation/percussion: Soft, nontender, nondistended, no rigidity, no abnormal dullness to percussion, no hepatosplenomegaly and no palpable abdominal masses.  Auscultation: Normal bowel sounds, no abdominal bruits. Rectal exam: Deferred SKIN:  turgor, no lesions seen. Musculoskeletal:  Normal muscle tone, normal strength. NEURO: Alert and oriented x 3, no focal neurologic deficits: No results found. I spent 25 minutes of face-to-face time with the patient. Time was spent on care, counseling and coordinating care.    Domenick Quebedeaux,MD 01/26/2018, 4:21 PM   CC Dr Welton Flakes

## 2018-01-26 NOTE — Patient Instructions (Signed)
If you are age 70 or older, your body mass index should be between 23-30. Your Body mass index is 21.99 kg/m. If this is out of the aforementioned range listed, please consider follow up with your Primary Care Provider.  If you are age 64 or younger, your body mass index should be between 19-25. Your Body mass index is 21.99 kg/m. If this is out of the aformentioned range listed, please consider follow up with your Primary Care Provider.   We have sent the following medications to your pharmacy for you to pick up at your convenience: Protonix 40mg twice a day. Pepcid 20mg at bedtime.  Continue taking your Cholestryamine 4g daily as needed. Continue eating Activia. Minimize your Reglan intake.  You have been scheduled for a CT scan of the abdomen and pelvis at [facility] [place] [facility] ([address] [place], [place] [address] [address]).   You are scheduled on 01/31/2018 at 10:00am. You should arrive 15 minutes prior to your appointment time for registration. Please follow the written instructions below on the day of your exam:  WARNING: IF YOU ARE ALLERGIC TO IODINE/X-RAY DYE, PLEASE NOTIFY RADIOLOGY IMMEDIATELY AT [phone]! YOU WILL BE GIVEN A 13 HOUR PREMEDICATION PREP.  1) Do not eat or drink anything after 6:00am (4 hours prior to your test) 2) You have been given 2 bottles of oral contrast to drink. The solution may taste better if refrigerated, but do NOT add ice or any other liquid to this solution. Shake well before drinking.    Drink 1 bottle of contrast @ 8:00am (2 hours prior to your exam)  Drink 1 bottle of contrast @ 9:00am (1 hour prior to your exam)  You may take any medications as prescribed with a small amount of water, if necessary. If you take any of the following medications: METFORMIN, GLUCOPHAGE, GLUCOVANCE, AVANDAMET, RIOMET, FORTAMET, ACTOPLUS MET, JANUMET, GLUMETZA or METAGLIP, you MAY be asked to HOLD this medication 48 hours AFTER the exam.  The  purpose of you drinking the oral contrast is to aid in the visualization of your intestinal tract. The contrast solution may cause some diarrhea. Depending on your individual set of symptoms, you may also receive an intravenous injection of x-ray contrast/dye. Plan on being at [facility] for 30 minutes or longer, depending on the type of exam you are having performed.  This test typically takes 30-45 minutes to complete.  If you have any questions regarding your exam or if you need to reschedule, you may call the CT department at [phone] between the hours of 8:00 am and 5:00 pm, Monday-Friday.  ________________________________________________________________________  Thank you,  Dr. Rajesh Gupta   

## 2018-01-31 ENCOUNTER — Other Ambulatory Visit (INDEPENDENT_AMBULATORY_CARE_PROVIDER_SITE_OTHER): Payer: Medicare Other

## 2018-01-31 ENCOUNTER — Ambulatory Visit (HOSPITAL_BASED_OUTPATIENT_CLINIC_OR_DEPARTMENT_OTHER): Admission: RE | Admit: 2018-01-31 | Payer: Medicare Other | Source: Ambulatory Visit

## 2018-01-31 ENCOUNTER — Other Ambulatory Visit: Payer: Self-pay

## 2018-01-31 DIAGNOSIS — R197 Diarrhea, unspecified: Secondary | ICD-10-CM | POA: Diagnosis not present

## 2018-01-31 DIAGNOSIS — R634 Abnormal weight loss: Secondary | ICD-10-CM | POA: Diagnosis not present

## 2018-01-31 DIAGNOSIS — R059 Cough, unspecified: Secondary | ICD-10-CM

## 2018-01-31 DIAGNOSIS — R05 Cough: Secondary | ICD-10-CM

## 2018-01-31 LAB — COMPREHENSIVE METABOLIC PANEL
ALBUMIN: 4.4 g/dL (ref 3.5–5.2)
ALK PHOS: 52 U/L (ref 39–117)
ALT: 21 U/L (ref 0–35)
AST: 26 U/L (ref 0–37)
BUN: 20 mg/dL (ref 6–23)
CO2: 27 mEq/L (ref 19–32)
Calcium: 9.7 mg/dL (ref 8.4–10.5)
Chloride: 100 mEq/L (ref 96–112)
Creatinine, Ser: 0.92 mg/dL (ref 0.40–1.20)
GFR: 60.47 mL/min (ref >60.00)
GLUCOSE: 76 mg/dL (ref 70–99)
POTASSIUM: 4.8 meq/L (ref 3.5–5.1)
Sodium: 133 mEq/L — ABNORMAL LOW (ref 135–145)
TOTAL PROTEIN: 6.9 g/dL (ref 6.0–8.3)
Total Bilirubin: 0.3 mg/dL (ref 0.2–1.2)

## 2018-01-31 NOTE — Progress Notes (Signed)
Ct abPatient needs a CMP before CT.

## 2018-02-01 LAB — GASTROINTESTINAL PATHOGEN PANEL PCR
C. DIFFICILE TOX A/B, PCR: NOT DETECTED
CAMPYLOBACTER, PCR: NOT DETECTED
Cryptosporidium, PCR: NOT DETECTED
E COLI (STEC) STX1/STX2, PCR: NOT DETECTED
E COLI 0157, PCR: NOT DETECTED
E coli (ETEC) LT/ST PCR: NOT DETECTED
Giardia lamblia, PCR: NOT DETECTED
Norovirus, PCR: NOT DETECTED
Rotavirus A, PCR: NOT DETECTED
SALMONELLA, PCR: NOT DETECTED
Shigella, PCR: NOT DETECTED

## 2018-02-06 ENCOUNTER — Telehealth: Payer: Self-pay | Admitting: Gastroenterology

## 2018-02-06 NOTE — Telephone Encounter (Signed)
Called and spoke with patient- patient reports having multiple bowel movements a day and wanted to know if she still needed to provide the stool sample that Dr. Chales Abrahams recommended be done-advised patient that a stool sample is still needed and completing the CT scan may be informative in helping Dr. Chales Abrahams have a better plan of care for her; patient reports she will submit the stool sample and complete the CT but may call be if symptoms continue after the CT scan; Patient verbalized understanding of information/instructions; Patient was advised to call back if questions/concerns arise;

## 2018-02-06 NOTE — Telephone Encounter (Signed)
Pt called in and stated that she is having real bad pain and diarrhea and want to know if she should be seen ASAP or wait until up schd ct scan that is coming up on thur.

## 2018-02-08 ENCOUNTER — Encounter (HOSPITAL_BASED_OUTPATIENT_CLINIC_OR_DEPARTMENT_OTHER): Payer: Self-pay

## 2018-02-08 ENCOUNTER — Other Ambulatory Visit: Payer: Medicare Other

## 2018-02-08 ENCOUNTER — Ambulatory Visit (HOSPITAL_BASED_OUTPATIENT_CLINIC_OR_DEPARTMENT_OTHER)
Admission: RE | Admit: 2018-02-08 | Discharge: 2018-02-08 | Disposition: A | Discharge status: Home / Self Care | Payer: Medicare Other | Source: Ambulatory Visit | Attending: Gastroenterology | Admitting: Gastroenterology

## 2018-02-08 DIAGNOSIS — R634 Abnormal weight loss: Secondary | ICD-10-CM | POA: Diagnosis present

## 2018-02-08 DIAGNOSIS — R197 Diarrhea, unspecified: Secondary | ICD-10-CM | POA: Diagnosis not present

## 2018-02-08 DIAGNOSIS — R05 Cough: Secondary | ICD-10-CM

## 2018-02-08 DIAGNOSIS — R059 Cough, unspecified: Secondary | ICD-10-CM

## 2018-02-08 MED ORDER — IOPAMIDOL (ISOVUE-300) INJECTION 61%
100.0000 mL | Freq: Once | INTRAVENOUS | Status: AC | PRN
  Administered 2018-02-08: 100 mL via INTRAVENOUS

## 2018-02-09 LAB — FECAL LACTOFERRIN, QUANT
FECAL LACTOFERRIN: NEGATIVE
MICRO NUMBER: 128206
SPECIMEN QUALITY:: ADEQUATE

## 2018-02-13 ENCOUNTER — Telehealth: Payer: Self-pay | Admitting: Gastroenterology

## 2018-02-13 NOTE — Telephone Encounter (Signed)
Dr. Chales Abrahams pt-Called and spoke with patient- patient reports the abdominal pain "has been going on for a month now"-CT scan and lab work completed and patient reports she has been "watching what I am eating so that I can try to not have these symptoms"; patient reports she is still "having diarrhea no matter what she eats-and if I take the medication it stops me up too bad";  Patient reports she is taking the Protonix and Pepcid as prescribed; patient also reports she is taking OTC anti-diarrheal medication and "it completely stops me up"- patient states "I am at a complete loss because I can't get to a normal with my bowel movements";   Please advise

## 2018-02-14 NOTE — Telephone Encounter (Signed)
Called and spoke with patient-patient reports she is taking a daily probiotic and also eating "Activia" daily; informed patient of CT results (negative), negative pathogen panel, patient reports she has not been using the Cholestyramine until a "dose of that medicine at lunch time today because I did not think to take it"; patient instructed on taking the medication as prescribed; informed of PRN medication and patient report she would like to try the medication (Bentyl);  Please clarify quantity and # of refills on the Bentyl;

## 2018-02-14 NOTE — Telephone Encounter (Signed)
Chart reviewed - this is a complicated situation. Reassure her CT scan is normal and nothing concerning on imaging. GI pathogen panel negative. It looks like she had some antibiotics recently, not sure if she is still taking those as that could cause her altered bowels. She can try a probiotic if she hasn't already, Florastor or VSL#3 OTC a few times per day to see if that helps. You can give her a trial of bentyl 10mg  q 8 hours to use as needed for pain. She is written for cholestyramine, not sure if she is taking that or what she is taking for diarrhea otherwise.

## 2018-02-15 ENCOUNTER — Other Ambulatory Visit: Payer: Self-pay

## 2018-02-15 DIAGNOSIS — R197 Diarrhea, unspecified: Secondary | ICD-10-CM

## 2018-02-15 MED ORDER — DICYCLOMINE HCL 10 MG PO CAPS: ORAL_CAPSULE | ORAL | 3 refills | Status: DC

## 2018-02-15 NOTE — Telephone Encounter (Signed)
Bentyl is 10mg  tab - 1 to 2 tabs every 8 hours as needed. #30 RF3. Thanks

## 2018-02-15 NOTE — Telephone Encounter (Signed)
Medication ordered per MD recommendations;

## 2018-03-02 ENCOUNTER — Telehealth: Payer: Self-pay | Admitting: Gastroenterology

## 2018-03-02 NOTE — Telephone Encounter (Signed)
Pt is sched 3/3 with Dr. Chales Abrahams.  Pt reported she has severe abd p to the point that she cannot sleep.  She has tried IBgard and it does not work for her.  Please advise.

## 2018-03-02 NOTE — Telephone Encounter (Signed)
Stop IBgard -causing headaches Must take Bentyl 10 mg p.o. 4 times daily, half an hour before meals and at bedtime For insomnia-she needs to get in touch with her family physician She has appointment coming up She is to bring all the medications she is taking including over-the-counter medications

## 2018-03-02 NOTE — Telephone Encounter (Signed)
Called and spoke with patient-patient reports she has been careful with diet and has been taking the medication and it is not working for her-patient reports she is "waking up around 3-4 in the morning and I just don't know what else to do";  Patient reports she thinks the IBGard is causing her to have a headache and she does not want to take the medication if it is going to give her a headache;   Please advise

## 2018-03-05 NOTE — Telephone Encounter (Signed)
Unable to reach patient at this time; will attempt to reach patient at a later date/time as RN unable to leave a message;

## 2018-03-05 NOTE — Telephone Encounter (Signed)
Patient returned call to office- spoke with patient concerning MD recommendations and RX that was sent to pharmacy on 02/15/2018 for Bentyl; patient verbalized understanding of need to follow up with PCP, to bring all medications she is currently taking, even OTC meds, to her appt with Dr. Harland German on 03/13/2018; Patient was advised to call back if questions/concerns arise;

## 2018-03-10 ENCOUNTER — Other Ambulatory Visit: Payer: Self-pay | Admitting: Gastroenterology

## 2018-03-10 DIAGNOSIS — R197 Diarrhea, unspecified: Secondary | ICD-10-CM

## 2018-03-12 NOTE — Telephone Encounter (Signed)
Please address

## 2018-03-13 ENCOUNTER — Encounter: Payer: Self-pay | Admitting: Gastroenterology

## 2018-03-13 ENCOUNTER — Ambulatory Visit: Payer: Medicare Other | Admitting: Gastroenterology

## 2018-03-13 VITALS — BP 140/76 | HR 72 | Ht 63.0 in | Wt 121.0 lb

## 2018-03-13 DIAGNOSIS — R1031 Right lower quadrant pain: Secondary | ICD-10-CM | POA: Diagnosis not present

## 2018-03-13 DIAGNOSIS — R634 Abnormal weight loss: Secondary | ICD-10-CM

## 2018-03-13 DIAGNOSIS — K580 Irritable bowel syndrome with diarrhea: Secondary | ICD-10-CM

## 2018-03-13 MED ORDER — SOD PICOSULFATE-MAG OX-CIT ACD 10-3.5-12 MG-GM -GM/160ML PO SOLN: 1.0000 | Freq: Once | ORAL | 0 refills | Status: AC

## 2018-03-13 NOTE — Progress Notes (Signed)
IMPRESSION and PLAN:   #1. RLQ pain.  Neg GI pathogen. Neg CT 02/08/2018. Has  #2. Weight Loss (30lb over last 1 year)-  Nl TSH. Neg colon 09/2015 except for small polyps. Neg EGD 09/18/2013 except for small HH, Schatzki's ring s/p dil.  #3. Cough (resolved) (has been on anti-allergy meds, neg chest Xray). Had recent antibiotics.  #4. IBS with alt constipation and diarrhea.  Plan: - Please obtain previous labs (Dr Milta Deiters office) done 02/2018.  - Proceed with colonoscopy. Discussed risks & benefits. (Risks including rare perforation req laparotomy, bleeding after biopsies/polypectomy req blood transfusion, rare chance of missing neoplasms, risks of anesthesia/sedation). Benefits outweigh the risks. Patient agrees to proceed. All the questions were answered. Consent forms given for review. - Stop cholestyramine. - Continue activia. - Minimize reglan (as it can cause diarrhea and extrapyramidal side effects) - FU after the above is complete.      HPI:    Chief Complaint:   Robin Hunter is a 68 y.o. female  For follow-up visit  Lower abdominal pain mainly R sided x 2 months  Now with constipation.  Has lost 4 more pounds since the last visit.  Has not been trying to lose weight.  No melena or hematochezia.  CT scan Abdo/pelvis was negative.  Patient quite concerned about colon cancers.  Does have some back problems.  Teaches water aerobics.   Past Medical History:  Diagnosis Date  . Abdominal distension   . Allergic rhinitis   . CRI (chronic renal insufficiency), stage 2 (mild)   . Dysphagia   . Epigastric pain   . GERD (gastroesophageal reflux disease)   . History of colon polyps   . Hypercholesteremia   . Hypothyroidism   . IBS (irritable colon syndrome)    with Constipation  . Insomnia   . Migraine   . Osteoarthritis   . Weight loss     Current Outpatient Medications  Medication Sig Dispense Refill  . acetaZOLAMIDE (DIAMOX) 250 MG tablet  Take 250 mg by mouth at bedtime.    . carisoprodol (SOMA) 350 MG tablet Take 350 mg by mouth every 8 (eight) hours as needed.    . celecoxib (CELEBREX) 200 MG capsule Take 200 mg by mouth daily.    . cholestyramine (QUESTRAN) 4 g packet USE 1 PACKET AS DIRECTED TWICE A DAY (Patient taking differently: as needed. ) 60 packet 1  . Coenzyme Q10 (CO Q-10) 100 MG CAPS Take 100 mg by mouth daily.    Marland Kitchen dicyclomine (BENTYL) 10 MG capsule Take 1 or 2 tabs every 8 hours IF needed for abdominal spasms 30 capsule 3  . famotidine (PEPCID) 20 MG tablet Take 1 tablet (20 mg total) by mouth at bedtime. 30 tablet 11  . gabapentin (NEURONTIN) 600 MG tablet Take 600 mg by mouth 3 (three) times daily.    . Galcanezumab-gnlm (EMGALITY) 120 MG/ML SOAJ Inject 120 mg into the skin every 30 (thirty) days.    Marland Kitchen imipramine (TOFRANIL) 50 MG tablet Take 50 mg by mouth at bedtime.    Marland Kitchen levothyroxine (SYNTHROID, LEVOTHROID) 88 MCG tablet Take 88 mcg by mouth daily before breakfast.    . metoCLOPramide (REGLAN) 10 MG tablet Take 10 mg by mouth 2 (two) times daily.     . Multiple Vitamin (MULTI-VITAMIN DAILY) TABS Take by mouth daily.    . Multiple Vitamins-Minerals (ICAPS PO) Take by mouth 2 (two) times daily.    . OnabotulinumtoxinA (BOTOX IJ) Inject  as directed every 3 (three) months.    . pantoprazole (PROTONIX) 40 MG tablet Take 1 tablet (40 mg total) by mouth 2 (two) times daily. 90 tablet 6  . saccharomyces boulardii (FLORASTOR) 250 MG capsule Take 250 mg by mouth daily.    . vitamin C (ASCORBIC ACID) 500 MG tablet Take 500 mg by mouth daily.     No current facility-administered medications for this visit.     Past Surgical History:  Procedure Laterality Date  . CHOLECYSTECTOMY, LAPAROSCOPIC  1992  . COLONOSCOPY  09/2016   polyps  . COLONOSCOPY  09/23/2015   small colonic polyp. mild sigmoid diverticulosis. BX: hyperplastic polyp  . COLONOSCOPY  01/20/2010   small colonic polyp, mild sigmoid diverticulosis,  small internal hemorrhoids. BX: negative  . HUMERUS FRACTURE SURGERY     stainless steel rod placed  . NASAL SINUS SURGERY    . TMJ ARTHROPLASTY  11/1999  . TRANSPHENOIDAL PITUITARY RESECTION  08/1998  . UPPER GI ENDOSCOPY  09/18/2013   schatzki ring . Small Hiatal Hernia. Torturous esophagus suggestive of esophageal dysmotility. BX: benign squamous epithelim with focal basal cell hyperplasia and rare intraepitheal eosinophillis.    Family History  Problem Relation Age of Onset  . Diabetes Mother   . Diabetes Sister   . Irritable bowel syndrome Sister   . Breast cancer Maternal Aunt   . Colon cancer Neg Hx   . Esophageal cancer Neg Hx     Social History   Tobacco Use  . Smoking status: Never Smoker  . Smokeless tobacco: Never Used  Substance Use Topics  . Alcohol use: Not Currently  . Drug use: Never    Allergies  Allergen Reactions  . Levofloxacin     headache     Review of Systems: All systems reviewed and negative except where noted in HPI.    Physical Exam:     BP 140/76   Pulse 72   Ht 5\' 3"  (1.6 m)   Wt 121 lb (54.9 kg)   SpO2 98%   BMI 21.43 kg/m  @WEIGHTLAST3 @ GENERAL:  Alert, oriented, cooperative, not in acute distress. PSYCH: :Pleasant, normal mood and affect. HEENT:  conjunctiva pink, mucous membranes moist, neck supple without masses. No jaundice. CARDIAC:  S1 S2 normal. No murmers. PULM: Normal respiratory effort, lungs CTA bilaterally, no wheezing. ABDOMEN: Inspection: No visible peristalsis, no abnormal pulsations, skin normal.  Palpation/percussion: Soft, nontender, nondistended, no rigidity, no abnormal dullness to percussion, no hepatosplenomegaly and no palpable abdominal masses.  Auscultation: Normal bowel sounds, no abdominal bruits. Rectal exam:  SKIN:  turgor, no lesions seen. Musculoskeletal:  Normal muscle tone, normal strength. NEURO: Alert and oriented x 3, no focal neurologic deficits:  I spent 25 minutes of face-to-face time  with the patient. Time was spent on care, counseling and coordinating care. CT scan reviewed independently.  Copy of the CT scan report given to the patient.    Haeley Fordham,MD 03/13/2018, 4:16 PM   CC Dr Welton Flakes

## 2018-03-13 NOTE — Patient Instructions (Signed)
If you are age 70 or older, your body mass index should be between 23-30. Your Body mass index is 21.43 kg/m. If this is out of the aforementioned range listed, please consider follow up with your Primary Care Provider.  If you are age 19 or younger, your body mass index should be between 19-25. Your Body mass index is 21.43 kg/m. If this is out of the aformentioned range listed, please consider follow up with your Primary Care Provider.   You have been scheduled for a colonoscopy. Please follow written instructions given to you at your visit today.  Please pick up your prep supplies at the pharmacy within the next 1-3 days. If you use inhalers (even only as needed), please bring them with you on the day of your procedure. Your physician has requested that you go to www.startemmi.com and enter the access code given to you at your visit today. This web site gives a general overview about your procedure. However, you should still follow specific instructions given to you by our office regarding your preparation for the procedure.  We have given you samples of the following medication to take: Clenpiq  Thank you,  Dr. Lynann Bologna

## 2018-03-16 ENCOUNTER — Encounter: Payer: Self-pay | Admitting: Gastroenterology

## 2018-03-26 ENCOUNTER — Telehealth: Payer: Self-pay | Admitting: Gastroenterology

## 2018-03-26 NOTE — Telephone Encounter (Signed)
The pt was advised to continue her prednisone as prescribed.  The patient has been notified of this information and all questions answered.

## 2018-03-26 NOTE — Telephone Encounter (Signed)
Pt has procedure scheduled on Thursday 3/19, she wants to know if she could continue taking prednisone.

## 2018-03-28 ENCOUNTER — Telehealth: Payer: Self-pay

## 2018-03-28 NOTE — Telephone Encounter (Signed)
Covid-19 travel screening questions  Have you traveled in the last 14 days? If yes where?  Do you now or have you had a fever in the last 14 days?  Do you have any respiratory symptoms of shortness of breath or cough now or in the last 14 days?  Do you have a medical history of Congestive Heart Failure?  Do you have a medical history of lung disease?  Do you have any family members or close contacts with diagnosed or suspected Covid-19?      Called and left message at 2:20 about our screening survey. Advised patient to call back when available.

## 2018-03-29 ENCOUNTER — Ambulatory Visit (AMBULATORY_SURGERY_CENTER): Payer: Medicare Other | Admitting: Gastroenterology

## 2018-03-29 ENCOUNTER — Other Ambulatory Visit: Payer: Self-pay

## 2018-03-29 ENCOUNTER — Encounter: Payer: Self-pay | Admitting: Gastroenterology

## 2018-03-29 VITALS — BP 114/75 | HR 80 | Temp 96.7°F | Resp 19 | Ht 63.0 in | Wt 121.0 lb

## 2018-03-29 DIAGNOSIS — R1031 Right lower quadrant pain: Secondary | ICD-10-CM

## 2018-03-29 DIAGNOSIS — K5289 Other specified noninfective gastroenteritis and colitis: Secondary | ICD-10-CM

## 2018-03-29 DIAGNOSIS — K5730 Diverticulosis of large intestine without perforation or abscess without bleeding: Secondary | ICD-10-CM

## 2018-03-29 DIAGNOSIS — R197 Diarrhea, unspecified: Secondary | ICD-10-CM

## 2018-03-29 MED ORDER — SODIUM CHLORIDE 0.9 % IV SOLN: 500.0000 mL | INTRAVENOUS | Status: DC

## 2018-03-29 NOTE — Op Note (Signed)
[facility] Patient Name: Robin Hunter Procedure Date: 03/29/2018 2:33 PM MRN: [id number] Endoscopist: Lynann Bologna , MD Age: 68 Referring MD:  Date of Birth: January 15, 1950 Gender: Female Account #: [id number] Procedure:                Colonoscopy Indications:              Abdominal pain in the left lower quadrant, Diarrhea                            with alternating constipation, weight loss negative                            CT abdo/pelvis 01/2018 Medicines:                Monitored Anesthesia Care Procedure:                Pre-Anesthesia Assessment:                           - Prior to the procedure, a History and Physical                            was performed, and patient medications and                            allergies were reviewed. The patient's tolerance of                            previous anesthesia was also reviewed. The risks                            and benefits of the procedure and the sedation                            options and risks were discussed with the patient.                            All questions were answered, and informed consent                            was obtained. Prior Anticoagulants: The patient has                            taken no previous anticoagulant or antiplatelet                            agents. ASA Grade Assessment: II - A patient with                            mild systemic disease. After reviewing the risks                            and benefits, the patient was deemed in  satisfactory condition to undergo the procedure.                           After obtaining informed consent, the colonoscope                            was passed under direct vision. Throughout the                            procedure, the patient's blood pressure, pulse, and                            oxygen saturations were monitored continuously. The                            Colonoscope was introduced through  the anus and                            advanced to the 2 cm into the ileum. The                            colonoscopy was performed without difficulty. The                            patient tolerated the procedure well. The quality                            of the bowel preparation was good. The terminal                            ileum, ileocecal valve, appendiceal orifice, and                            rectum were photographed. The colon was highly                            redundant. Scope In: 2:39:45 PM Scope Out: 2:58:41 PM Scope Withdrawal Time: 0 hours 13 minutes 33 seconds  Total Procedure Duration: 0 hours 18 minutes 56 seconds  Findings:                 A few small-mouthed diverticula were found in the                            sigmoid colon.                           The colon (entire examined portion) appeared normal                            with well preserved vascular pattern. Biopsies for                            histology were taken with a cold forceps from the  entire colon for evaluation of microscopic colitis.                            Estimated blood loss: none.                           Non-bleeding internal hemorrhoids were found during                            retroflexion. The hemorrhoids were small.                           The exam was otherwise without abnormality on                            direct and retroflexion views.                           The terminal ileum appeared normal. Complications:            No immediate complications. Estimated Blood Loss:     Estimated blood loss: none. Impression:               -Mild sigmoid diverticulosis.                           -Otherwise normal colonoscopy to TI. Highly                            redundant colon. Recommendation:           - Patient has a contact number available for                            emergencies. The signs and symptoms of potential                             delayed complications were discussed with the                            patient. Return to normal activities tomorrow.                            Written discharge instructions were provided to the                            patient.                           - Resume previous diet.                           - Continue present medications.                           - Await pathology results.                           -  Repeat colonoscopy in 10 years for screening                            purposes. Earlier, if any new problems or if there                            are any change in family history.                           - Return to GI clinic in 12 weeks. Lynann Bologna, MD 03/29/2018 3:06:20 PM This report has been signed electronically.

## 2018-03-29 NOTE — Progress Notes (Signed)
Report to PACU, RN, vss, BBS= Clear.  

## 2018-03-29 NOTE — Patient Instructions (Signed)
Information on hemorrhoids given to you today.  Await pathology results.   YOU HAD AN ENDOSCOPIC PROCEDURE TODAY AT THE  ENDOSCOPY CENTER:   Refer to the procedure report that was given to you for any specific questions about what was found during the examination.  If the procedure report does not answer your questions, please call your gastroenterologist to clarify.  If you requested that your care partner not be given the details of your procedure findings, then the procedure report has been included in a sealed envelope for you to review at your convenience later.  YOU SHOULD EXPECT: Some feelings of bloating in the abdomen. Passage of more gas than usual.  Walking can help get rid of the air that was put into your GI tract during the procedure and reduce the bloating. If you had a lower endoscopy (such as a colonoscopy or flexible sigmoidoscopy) you may notice spotting of blood in your stool or on the toilet paper. If you underwent a bowel prep for your procedure, you may not have a normal bowel movement for a few days.  Please Note:  You might notice some irritation and congestion in your nose or some drainage.  This is from the oxygen used during your procedure.  There is no need for concern and it should clear up in a day or so.  SYMPTOMS TO REPORT IMMEDIATELY:   Following lower endoscopy (colonoscopy or flexible sigmoidoscopy):  Excessive amounts of blood in the stool  Significant tenderness or worsening of abdominal pains  Swelling of the abdomen that is new, acute  Fever of 100F or higher   For urgent or emergent issues, a gastroenterologist can be reached at any hour by calling [phone].   DIET:  We do recommend a small meal at first, but then you may proceed to your regular diet.  Drink plenty of fluids but you should avoid alcoholic beverages for 24 hours.  ACTIVITY:  You should plan to take it easy for the rest of today and you should NOT DRIVE or use heavy  machinery until tomorrow (because of the sedation medicines used during the test).    FOLLOW UP: Our staff will call the number listed on your records the next business day following your procedure to check on you and address any questions or concerns that you may have regarding the information given to you following your procedure. If we do not reach you, we will leave a message.  However, if you are feeling well and you are not experiencing any problems, there is no need to return our call.  We will assume that you have returned to your regular daily activities without incident.  If any biopsies were taken you will be contacted by phone or by letter within the next 1-3 weeks.  Please call us at [phone] if you have not heard about the biopsies in 3 weeks.    SIGNATURES/CONFIDENTIALITY: You and/or your care partner have signed paperwork which will be entered into your electronic medical record.  These signatures attest to the fact that that the information above on your After Visit Summary has been reviewed and is understood.  Full responsibility of the confidentiality of this discharge information lies with you and/or your care-partner.

## 2018-03-29 NOTE — Progress Notes (Signed)
Pt right eye is red and swollen shut.  Temp 96.9.  Pt c/o pain rating 6/10.  Pt said her eye started to swell during the night.  She took Tylenol without any relief.  She called her eye MD and they could not see her today.  She was told to call [facility] and she has and waiting to her back from them.  Dr. Chales Abrahams & Sharia Reeve were both made aware of pt's symptoms.  Dr. Chales Abrahams saw pt in admitting and said that we would proceed with colonoscopy since pt has taken her prep.

## 2018-03-29 NOTE — Progress Notes (Signed)
Called to room to assist during endoscopic procedure.  Patient ID and intended procedure confirmed with present staff. Received instructions for my participation in the procedure from the performing physician.  

## 2018-03-30 ENCOUNTER — Telehealth: Payer: Self-pay | Admitting: *Deleted

## 2018-03-30 NOTE — Telephone Encounter (Signed)
  Follow up Call-  Call back number 03/29/2018  Post procedure Call Back phone  # #[phone] cell  Permission to leave phone message Yes  Some recent data might be hidden     Patient questions:  Do you have a fever, pain , or abdominal swelling? No. Pain Score  0 *  Have you tolerated food without any problems? Yes.    Have you been able to return to your normal activities? Yes.    Do you have any questions about your discharge instructions: Diet   No. Medications  No. Follow up visit  No.  Do you have questions or concerns about your Car ? No.  Actions: * If pain score is 4 or above: No action needed, pain <4.

## 2018-04-09 ENCOUNTER — Telehealth: Payer: Self-pay | Admitting: Gastroenterology

## 2018-04-09 ENCOUNTER — Encounter: Payer: Self-pay | Admitting: Gastroenterology

## 2018-04-09 NOTE — Telephone Encounter (Signed)
Patient is advised.  

## 2018-04-09 NOTE — Telephone Encounter (Signed)
Have reviewed the pathology Sent her a letter. Biopsies-resolving colitis. 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 3 times daily (half an hour before 2 meals and bedtime) #90, 6 refills -Start probiotics (which ever she has) or align 1 tab po qd. -Call us in 2 weeks. -If she is not better, will give her a trial of rifaximin.

## 2018-04-09 NOTE — Telephone Encounter (Signed)
Dr Chales Abrahams please review pathology and advise

## 2018-04-09 NOTE — Telephone Encounter (Signed)
Pt had a colonoscopy on 3-19 and is still having the abdominal pain. She would like to know if the pathology results are back.

## 2018-04-16 ENCOUNTER — Telehealth: Payer: Self-pay | Admitting: Gastroenterology

## 2018-04-16 NOTE — Telephone Encounter (Signed)
Called the patient with this information. She states she actually is taking Bentyl ac meals and at bedtime. She is on Florastor and she eats daily Activia yogurt.

## 2018-04-16 NOTE — Telephone Encounter (Signed)
2 days of feeling her bowels were sluggish and somewhat constipated. Today bowel movement was softer but not diarrhea. "My bowel movements are still not normal." Reports her abdominal pain wakes her up during the night. She takes Gas-X but it does not help. She reports she is taking the Bentyl as instructed. She is interested in taking the next step in treatment.

## 2018-04-16 NOTE — Telephone Encounter (Signed)
Increase Bentyl to 10 mg p.o. 4 times daily, half an hour before meals and bedtime. #120 Continue probiotics Let us know if she is not better in 1 week

## 2018-04-17 ENCOUNTER — Other Ambulatory Visit: Payer: Self-pay | Admitting: Gastroenterology

## 2018-04-17 ENCOUNTER — Encounter: Payer: Medicare Other | Admitting: Gastroenterology

## 2018-04-17 DIAGNOSIS — R197 Diarrhea, unspecified: Secondary | ICD-10-CM

## 2018-04-17 NOTE — Telephone Encounter (Signed)
Patient is scheduled for virtual office visit on 04/26/2018 at 9:45am with Dr.Gupta via Zoom. Patient is aware she must download the app prior to appointment and we will send her a link via text message for the meeting.

## 2018-04-17 NOTE — Telephone Encounter (Signed)
Please put her in for tele-visit for next week Can she keep a food diary until then to determine which foods she is intolerant to

## 2018-04-17 NOTE — Telephone Encounter (Signed)
I have spoken with the patient. She agrees to this plan. Email is confirmed.  Please call her with an appointment time and an idea of how this will work. Thank you!

## 2018-04-26 ENCOUNTER — Telehealth (INDEPENDENT_AMBULATORY_CARE_PROVIDER_SITE_OTHER): Payer: Medicare Other | Admitting: Gastroenterology

## 2018-04-26 ENCOUNTER — Other Ambulatory Visit: Payer: Self-pay

## 2018-04-26 ENCOUNTER — Encounter: Payer: Self-pay | Admitting: Gastroenterology

## 2018-04-26 VITALS — Ht 63.0 in | Wt 120.0 lb

## 2018-04-26 DIAGNOSIS — R1031 Right lower quadrant pain: Secondary | ICD-10-CM

## 2018-04-26 DIAGNOSIS — R634 Abnormal weight loss: Secondary | ICD-10-CM

## 2018-04-26 DIAGNOSIS — R059 Cough, unspecified: Secondary | ICD-10-CM

## 2018-04-26 DIAGNOSIS — K219 Gastro-esophageal reflux disease without esophagitis: Secondary | ICD-10-CM

## 2018-04-26 DIAGNOSIS — K581 Irritable bowel syndrome with constipation: Secondary | ICD-10-CM

## 2018-04-26 DIAGNOSIS — R1319 Other dysphagia: Secondary | ICD-10-CM

## 2018-04-26 DIAGNOSIS — R05 Cough: Secondary | ICD-10-CM

## 2018-04-26 DIAGNOSIS — R140 Abdominal distension (gaseous): Secondary | ICD-10-CM

## 2018-04-26 DIAGNOSIS — R1310 Dysphagia, unspecified: Secondary | ICD-10-CM

## 2018-04-26 MED ORDER — PANTOPRAZOLE SODIUM 40 MG PO TBEC: 40.0000 mg | DELAYED_RELEASE_TABLET | Freq: Two times a day (BID) | ORAL | 6 refills | Status: DC

## 2018-04-26 MED ORDER — LINACLOTIDE 72 MCG PO CAPS: 72.0000 ug | ORAL_CAPSULE | Freq: Every day | ORAL | 6 refills | Status: AC

## 2018-04-26 NOTE — Progress Notes (Signed)
IMPRESSION and PLAN:   #1. RLQ pain (resolved).  Neg GI pathogen. Neg CT 02/08/2018. Neg colon except for mild diverticulosis, colon Bx showing focal resolving colitis 03/29/2018  #2. Weight Loss (resolved)-  Nl TSH. Neg colon 09/2015 except for small polyps. Neg EGD 09/18/2013 except for small HH, Schatzki's ring s/p dil.  #3. GERD with dysphagia. H/O Schatzki's ring.  #4. Cough (resolved) (has been on anti-allergy meds, neg chest Xray). Had recent antibiotics. Has chronic sinusitis (followed by Dr Ma/Dr Blenda Nicely)  #5. IBS with constipation with bloating. Has element of lactose intolerance. Previously had diarrhea (could have had post-infectious IBS)  Plan: - EGD with dil/SB Bx. - Protonix  po bid to continue. - Linzess po qd (#30), 6 refills. - Continue bentyl  QID prn. - Continue activia. - Minimize reglan (as it can cause extrapyramidal side effects) - FU after the above is complete. - She needs to review meds with neurology. (On several meds -Diamox, imipramine, gabapentin, Emgality, Soma, Fioricet) Can she come off any meds?       HPI:    Chief Complaint:   Robin Hunter is a 68 y.o. female  For follow-up visit  With intermittent dysphagia-just like before, mostly to solids, washes down with liquids, mid chest, more prominent over the last 2 to 3 weeks.  Has been having more constipation with bloating, somewhat better with gas-X  No melena or hematochezia.  CT scan Abdo/pelvis was negative.  Negative colonoscopy except for biopsies showing mild resolving colitis.  She had diarrhea before, now has more constipation.  Wants to try Linzess.  Does have some back problems.  Teaches water aerobics.  Has gained 3lb since last visit Past Medical History:  Diagnosis Date  . Abdominal distension   . Allergic rhinitis   . Allergy   . Cataract    bil cataracts removed  . CRI (chronic renal insufficiency), stage 2 (mild)   . Dysphagia   .  Epigastric pain   . GERD (gastroesophageal reflux disease)   . Glaucoma   . History of colon polyps   . Hypercholesteremia   . Hypothyroidism   . IBS (irritable colon syndrome)    with Constipation  . Insomnia   . Migraine   . Osteoarthritis   . Sleep apnea    wears c-pap  . Weight loss     Current Outpatient Medications  Medication Sig Dispense Refill  . acetaZOLAMIDE (DIAMOX) 250 MG tablet Take 250 mg by mouth at bedtime.    Marland Kitchen aspirin EC 81 MG tablet Take 81 mg by mouth daily.    . brimonidine-timolol (COMBIGAN) 0.2-0.5 % ophthalmic solution 1 drop every twelve (12) hours.    . butalbital-acetaminophen-caffeine (FIORICET WITH CODEINE) 50-325-40-30 MG capsule Take by mouth. As needed    . carisoprodol (SOMA) 350 MG tablet Take 350 mg by mouth every 8 (eight) hours as needed.    . cefdinir (OMNICEF) 300 MG capsule Take 300 mg by mouth 2 (two) times daily.    . celecoxib (CELEBREX) 200 MG capsule Take 200 mg by mouth daily.    . cholestyramine (QUESTRAN) 4 g packet USE 1 PACKET AS DIRECTED TWICE A DAY (Patient taking differently: As needed) 60 packet 1  . Coenzyme Q10 (CO Q-10) 100 MG CAPS Take 100 mg by mouth daily.    Marland Kitchen dicyclomine (BENTYL) 10 MG capsule TAKE 1 OR 2 TABS EVERY 8 HOURS IF NEEDED FOR ABDOMINAL SPASMS 30 capsule 3  . estradiol (  ESTRACE) 0.1 MG/GM vaginal cream 1 (ONE) GRAM THREE TIMES A WEEK AS NEEDED    . fluticasone (FLONASE) 50 MCG/ACT nasal spray 1 spray by Each Nare route daily.    Marland Kitchen. gabapentin (NEURONTIN) 600 MG tablet Take 600 mg by mouth 3 (three) times daily.    . Galcanezumab-gnlm (EMGALITY) 120 MG/ML SOAJ Inject 120 mg into the skin every 30 (thirty) days.    Marland Kitchen. imipramine (TOFRANIL) 50 MG tablet Take 50 mg by mouth at bedtime.    Marland Kitchen. latanoprost (XALATAN) 0.005 % ophthalmic solution PLACE 1 DROP INTO AFFECTED EYE(S) IN THE EVENING    . levothyroxine (SYNTHROID, LEVOTHROID) 88 MCG tablet Take 88 mcg by mouth daily before breakfast.    . Magnesium 250 MG TABS  Take by mouth daily.    . metoCLOPramide (REGLAN) 10 MG tablet Take 10 mg by mouth 2 (two) times daily. As needed    . Multiple Vitamin (MULTI-VITAMIN DAILY) TABS Take by mouth daily.    . OnabotulinumtoxinA (BOTOX IJ) Inject as directed every 3 (three) months. headaches    . pantoprazole (PROTONIX) 40 MG tablet Take 1 tablet (40 mg total) by mouth 2 (two) times daily. 90 tablet 6  . saccharomyces boulardii (FLORASTOR) 250 MG capsule Take 250 mg by mouth daily.    . vitamin C (ASCORBIC ACID) 500 MG tablet Take 500 mg by mouth daily.     No current facility-administered medications for this visit.     Past Surgical History:  Procedure Laterality Date  . CHOLECYSTECTOMY    . CHOLECYSTECTOMY, LAPAROSCOPIC  1992  . COLONOSCOPY  09/2016   polyps  . COLONOSCOPY  09/23/2015   small colonic polyp. mild sigmoid diverticulosis. BX: hyperplastic polyp  . COLONOSCOPY  01/20/2010   small colonic polyp, mild sigmoid diverticulosis, small internal hemorrhoids. BX: negative  . HUMERUS FRACTURE SURGERY     stainless steel rod placed  . NASAL SINUS SURGERY    . POLYPECTOMY    . TMJ ARTHROPLASTY  11/1999  . TRANSPHENOIDAL PITUITARY RESECTION  08/1998  . UPPER GASTROINTESTINAL ENDOSCOPY    . UPPER GI ENDOSCOPY  09/18/2013   schatzki ring . Small Hiatal Hernia. Torturous esophagus suggestive of esophageal dysmotility. BX: benign squamous epithelim with focal basal cell hyperplasia and rare intraepitheal eosinophillis.    Family History  Problem Relation Age of Onset  . Diabetes Mother   . Diabetes Sister   . Irritable bowel syndrome Sister   . Breast cancer Maternal Aunt   . Colon cancer Neg Hx   . Esophageal cancer Neg Hx   . Rectal cancer Neg Hx   . Stomach cancer Neg Hx     Social History   Tobacco Use  . Smoking status: Never Smoker  . Smokeless tobacco: Never Used  Substance Use Topics  . Alcohol use: Not Currently  . Drug use: Never    Allergies  Allergen Reactions  .  Levofloxacin     headache     Review of Systems: All systems reviewed and negative except where noted in HPI.    Physical Exam:     Ht 5\' 3"  (1.6 m)   Wt 120 lb (54.4 kg)   BMI 21.26 kg/m   Not performed since it was a tele-visit.  This service was provided via telemedicine.  The patient was located at home.  The provider was located in office.  The patient did consent to this tele-visit and is aware of possible charges through their insurance for this visit.  Time spent on call: 25 min     Jaiana Sheffer,MD 04/26/2018, 9:32 AM   CC Dr Welton Flakes

## 2018-04-26 NOTE — Patient Instructions (Addendum)
EGD with dil/SB Bx.   05/14/2018 at 9:30am  Separate instructions have been mailed today    Protonix 40mg  po bid to continue.   Linzess 72 mcg po qd (#30), 6 refills.   Continue bentyl 10mg  QID prn.   Continue [place].   Minimize reglan (as it can cause extrapyramidal side effects)   FU after the above is complete.   She needs to review meds with neurology. On several meds. Can she come off any meds?   You have been scheduled for an endoscopy. Please follow written instructions given to you at your visit today. If you use inhalers (even only as needed), please bring them with you on the day of your procedure.  Thank you for choosing [facility]  Dr Chales Abrahams

## 2018-04-30 ENCOUNTER — Encounter: Payer: Self-pay | Admitting: Gastroenterology

## 2018-05-03 ENCOUNTER — Telehealth: Payer: Self-pay | Admitting: Gastroenterology

## 2018-05-03 NOTE — Telephone Encounter (Signed)
Patient said that she is still having constipation even after the med she was given last week.

## 2018-05-03 NOTE — Telephone Encounter (Signed)
Dr Gupta Please advise  

## 2018-05-04 NOTE — Telephone Encounter (Signed)
Lets double up on Linzess for now (to ) PO QD or she can take 2, if she has filled previous prescription. I believe she is coming in for EGD in 2 weeks.

## 2018-05-04 NOTE — Telephone Encounter (Signed)
Spoke with the patient. She will take 2 of the 72 mcg Linzess. Take before her morning meal.  Follow up by phone with her progress. If this works, she will ask for a prescription.

## 2018-05-13 ENCOUNTER — Telehealth: Payer: Self-pay | Admitting: *Deleted

## 2018-05-13 NOTE — Telephone Encounter (Signed)
Covid-19 travel screening questions  Have you traveled in the last 14 days?no If yes where?  Do you now or have you had a fever in the last 14 days?no  Do you have any respiratory symptoms of shortness of breath or cough now or in the last 14 days?no  Do you have a medical history of Congestive Heart Failure?no  Do you have a medical history of lung disease?no  Do you have any family members or close contacts with diagnosed or suspected Covid-19?no     Spoke with patient regarding Care partner policy and bringing their own PPE if they have it. Pt verbalized understanding. SM  

## 2018-05-13 NOTE — Telephone Encounter (Signed)
Left message for patient regarding Covid 19 screening. Will attempt to call back. SM 

## 2018-05-14 ENCOUNTER — Ambulatory Visit (AMBULATORY_SURGERY_CENTER): Payer: Medicare Other | Admitting: Gastroenterology

## 2018-05-14 ENCOUNTER — Encounter: Payer: Self-pay | Admitting: Gastroenterology

## 2018-05-14 ENCOUNTER — Other Ambulatory Visit: Payer: Self-pay

## 2018-05-14 VITALS — BP 98/59 | HR 71 | Temp 97.6°F | Resp 13 | Ht 63.0 in | Wt 120.0 lb

## 2018-05-14 DIAGNOSIS — K228 Other specified diseases of esophagus: Secondary | ICD-10-CM

## 2018-05-14 DIAGNOSIS — R1310 Dysphagia, unspecified: Secondary | ICD-10-CM | POA: Diagnosis not present

## 2018-05-14 DIAGNOSIS — K3189 Other diseases of stomach and duodenum: Secondary | ICD-10-CM | POA: Diagnosis not present

## 2018-05-14 DIAGNOSIS — K449 Diaphragmatic hernia without obstruction or gangrene: Secondary | ICD-10-CM | POA: Diagnosis not present

## 2018-05-14 MED ORDER — LINACLOTIDE 290 MCG PO CAPS: 290.0000 ug | ORAL_CAPSULE | Freq: Every day | ORAL | 6 refills | Status: AC

## 2018-05-14 MED ORDER — SODIUM CHLORIDE 0.9 % IV SOLN: 500.0000 mL | Freq: Once | INTRAVENOUS | Status: DC

## 2018-05-14 NOTE — Progress Notes (Signed)
Report given to PACU, vss 

## 2018-05-14 NOTE — Patient Instructions (Addendum)
Nothing by mouth until 10:30, clear liquids until 11:30, soft foods for the rest of today  Resume normal diet tomorrow. Continue Protonix as ordered. Monitor weight, if continue to lose weight Dr will proceed with solid-phase gastric emptying scan.   YOU HAD AN ENDOSCOPIC PROCEDURE TODAY AT THE [facility]:   Refer to the procedure report that was given to you for any specific questions about what was found during the examination.  If the procedure report does not answer your questions, please call your gastroenterologist to clarify.  If you requested that your care partner not be given the details of your procedure findings, then the procedure report has been included in a sealed envelope for you to review at your convenience later.  YOU SHOULD EXPECT: Some feelings of bloating in the abdomen. Passage of more gas than usual.  Walking can help get rid of the air that was put into your GI tract during the procedure and reduce the bloating. If you had a lower endoscopy (such as a colonoscopy or flexible sigmoidoscopy) you may notice spotting of blood in your stool or on the toilet paper. If you underwent a bowel prep for your procedure, you may not have a normal bowel movement for a few days.  Please Note:  You might notice some irritation and congestion in your nose or some drainage.  This is from the oxygen used during your procedure.  There is no need for concern and it should clear up in a day or so.  SYMPTOMS TO REPORT IMMEDIATELY:    Following upper endoscopy (EGD)  Vomiting of blood or coffee ground material  New chest pain or pain under the shoulder blades  Painful or persistently difficult swallowing  New shortness of breath  Fever of 100F or higher  Black, tarry-looking stools  For urgent or emergent issues, a gastroenterologist can be reached at any hour by calling [phone].   DIET: Nothing by mouth until 10:30, clear liquids until 11:30, soft foods the rest of  today. Tommorrow you may proceed to your regular diet.  Drink plenty of fluids but you should avoid alcoholic beverages for 24 hours.  ACTIVITY:  You should plan to take it easy for the rest of today and you should NOT DRIVE or use heavy machinery until tomorrow (because of the sedation medicines used during the test).    FOLLOW UP: Our staff will call the number listed on your records the next business day following your procedure to check on you and address any questions or concerns that you may have regarding the information given to you following your procedure. If we do not reach you, we will leave a message.  However, if you are feeling well and you are not experiencing any problems, there is no need to return our call.  We will assume that you have returned to your regular daily activities without incident.  If any biopsies were taken you will be contacted by phone or by letter within the next 1-3 weeks.  Please call us at [phone] if you have not heard about the biopsies in 3 weeks.    SIGNATURES/CONFIDENTIALITY: You and/or your care partner have signed paperwork which will be entered into your electronic medical record.  These signatures attest to the fact that that the information above on your After Visit Summary has been reviewed and is understood.  Full responsibility of the confidentiality of this discharge information lies with you and/or your care-partner.

## 2018-05-14 NOTE — Progress Notes (Signed)
Called to room to assist during endoscopic procedure.  Patient ID and intended procedure confirmed with present staff. Received instructions for my participation in the procedure from the performing physician.  

## 2018-05-14 NOTE — Op Note (Addendum)
[facility] Patient Name: Robin BenneJean Tift Procedure Date: 05/14/2018 8:58 AM MRN: [id number] Endoscopist: Lynann Bolognaajesh Joycelin Radloff , MD Age: [age] Referring MD:  Date of Birth: 16-Jul-1950 Gender: Female Account #: [id number] Procedure:                Upper GI endoscopy Indications:              Dysphagia, weight loss with negative CT Abdo/pelvis. Medicines:                Monitored Anesthesia Care Procedure:                Pre-Anesthesia Assessment:                           - Prior to the procedure, a History and Physical                            was performed, and patient medications and                            allergies were reviewed. The patient's tolerance of                            previous anesthesia was also reviewed. The risks                            and benefits of the procedure and the sedation                            options and risks were discussed with the patient.                            All questions were answered, and informed consent                            was obtained. Prior Anticoagulants: The patient has                            taken no previous anticoagulant or antiplatelet                            agents. ASA Grade Assessment: II - A patient with                            mild systemic disease. After reviewing the risks                            and benefits, the patient was deemed in                            satisfactory condition to undergo the procedure.                           After obtaining informed consent, the endoscope was  passed under direct vision. Throughout the                            procedure, the patient's blood pressure, pulse, and                            oxygen saturations were monitored continuously. The                            Model GIF-HQ190 [id number]) scope was introduced                            through the mouth, and advanced to the second part                            of  duodenum. The upper GI endoscopy was                            accomplished without difficulty. The patient                            tolerated the procedure well. Scope In: Scope Out: Findings:                 A wide open transient Schatzki ring was found at                            the gastroesophageal junction, 36 cm from incisors.                            Biopsies were obtained from the proximal and distal                            esophagus with cold forceps to r/o eosinophilic                            esophagitis. The scope was withdrawn. Dilation was                            performed with a Maloney dilator with no resistance                            at 50 Fr. Estimated blood loss: none.                           A small transient hiatal hernia was present (best                            visualized on withdrawal of the scope and on full                            distention of the lower esophagus).  The entire examined stomach was grossly normal.                            Limited due to retained food in the stomach without                            outlet obstruction. Biopsies were taken with a cold                            forceps for histology. Estimated blood loss: none.                           The examined duodenum was normal. Biopsies for                            histology were taken with a cold forceps for                            evaluation of celiac disease. Estimated blood loss:                            none. Complications:            No immediate complications. Estimated Blood Loss:     Estimated blood loss: none. Impression:               - Schatzki ring s/p esophageal dilatation                           - Small transient hiatal hernia.                           - Retained food without gastric outlet obstruction. Recommendation:           - Patient has a contact number available for                             emergencies. The signs and symptoms of potential                            delayed complications were discussed with the                            patient. Return to normal activities tomorrow.                            Written discharge instructions were provided to the                            patient.                           - Post dilatation diet.                           - Continue present medications including Protonix  40 mg p.o. twice daily                           - Await pathology results.                           - Return to GI clinic in 4 weeks. Monitor weight.                            If any further weight loss, proceed with                            solid-phase gastric emptying scan.                           -For constipation, increase Linzess 290 mcg p.o.                            once a day, #30, 6 refills. Lynann Bologna, MD 05/14/2018 9:23:20 AM This report has been signed electronically.

## 2018-05-14 NOTE — Progress Notes (Signed)
Dr. Chales Abrahams gave verbal order for patient to have Linzess po daily , with 6 refills. Order put in. Dr. Chales Abrahams said he would put it in procedure report and reprint it.

## 2018-05-15 ENCOUNTER — Telehealth: Payer: Self-pay

## 2018-05-15 NOTE — Telephone Encounter (Signed)
  Follow up Call-  Call back number 05/14/2018 03/29/2018  Post procedure Call Back phone  # [phone] #[phone] cell  Permission to leave phone message Yes Yes  Some recent data might be hidden     Patient questions:  Do you have a fever, pain , or abdominal swelling? No. Pain Score  0 *  Have you tolerated food without any problems? Yes.    Have you been able to return to your normal activities? Yes.    Do you have any questions about your discharge instructions: Diet   No. Medications  No. Follow up visit  No.  Do you have questions or concerns about your Care? No.  Actions: * If pain score is 4 or above: No action needed, pain <4.

## 2018-05-17 ENCOUNTER — Encounter: Payer: Self-pay | Admitting: Gastroenterology

## 2018-05-23 ENCOUNTER — Telehealth: Payer: Self-pay | Admitting: *Deleted

## 2018-05-23 NOTE — Telephone Encounter (Signed)
1. Have you developed a fever since your procedure? No  2.   Have you had an respiratory symptoms (SOB or cough) since your procedure? No  3.   Have you tested positive for COVID 19 since your procedure No  3.   Have you had any family members/close contacts diagnosed with the COVID 19 since your procedure?  No   If any of these questions are a yes, please inquire if patient has been seen by family doctor and route this note to Laverna Peace, Charity fundraiser.  Pt states she has been seeing her doctor for a sinus infection which she has not been able to get rid of. She does have a dry cough which is related. She denies fever. She does state she is being followed by her Dr. closely, she has not been tested for Covid-19. They are hoping not to have to treat her with another antibiotic. I strongly reminded her that if symptoms worsen or she is tested, to please let us know results.

## 2018-06-07 ENCOUNTER — Telehealth: Payer: Self-pay | Admitting: Gastroenterology

## 2018-06-07 NOTE — Telephone Encounter (Signed)
Patient reports 5/21, took Linzess in the am, vomited "a couple of times" and had 3 episodes of watery diarrhea (large amounts), fecal incontinence, "passed out" in the bathroom, falling to the floor (had to be helped up by her husband). Patient reports no injuries from the fall (but does endorse several episode of "seeing blackness" that starts peripherally and progresses centrally in her line of vision for the next 2 days). That night, took imodium, diarrhea stopped. 5/22, did not take Linzess, felt nauseous, 1x emesis during the day, that night developed diarrhea again (2-3 times large amounts). The patient reports taking a "powder medicine" to help. Patient reports 5/23-5/26, continued to take daily Linzess as prescribed, "felt better," denies N/V/D but reports not having a bowel movement. 5/27, nausea returned with one episode of emesis, no diarrhea or normal BM. 5/28, abdominal pain "all over" started. The patient describes this as crampy and "stabbing." The patient reports "nothing makes the pain worse or better." The patient took dicyclomine a few minutes prior to our phone call which was not enough time to help ease the pain. No diarrhea or normal BM today. The patient endorse dizziness when changing positions (from laying to sitting or sitting to standing). The patient states urine output decreased 5/21 and 5/22 but states has since returned to normal. Please advise.

## 2018-06-07 NOTE — Telephone Encounter (Signed)
Plan -Stop taking Linzess -Also stopped taking Imodium -If she gets constipated (No BM x 2 days), just take MiraLAX 17g once a day until [facility] for now. -For dizziness and ? syncope, can she see Dr. Welton Flakes today (can do televist).  He may decide to do some blood work.  Sounds more like vasovagal syncope.  I am glad that there are no injuries.

## 2018-06-07 NOTE — Telephone Encounter (Signed)
Spoke to the patient and relayed Dr. Urban Gibson recommendations: d/c Linzess and Imodium and to start Miralax 17g daily for constipation. Also to call Dr. Milta Deiters office for a telehealth visit. The patient verbalized understanding.

## 2018-06-07 NOTE — Telephone Encounter (Signed)
PT called and wanting to speak with the nurse she stated that she can not have a bm and she is hurting and sick to her stomach and she is taking her linzess every morning. She is wanting a call back to advised what to do.

## 2018-06-12 ENCOUNTER — Encounter: Payer: Self-pay | Admitting: Gastroenterology

## 2018-06-12 ENCOUNTER — Other Ambulatory Visit: Payer: Self-pay

## 2018-06-12 ENCOUNTER — Telehealth (INDEPENDENT_AMBULATORY_CARE_PROVIDER_SITE_OTHER): Payer: Medicare Other | Admitting: Gastroenterology

## 2018-06-12 VITALS — Ht 63.0 in | Wt 110.0 lb

## 2018-06-12 DIAGNOSIS — K581 Irritable bowel syndrome with constipation: Secondary | ICD-10-CM

## 2018-06-12 DIAGNOSIS — K219 Gastro-esophageal reflux disease without esophagitis: Secondary | ICD-10-CM

## 2018-06-12 DIAGNOSIS — R634 Abnormal weight loss: Secondary | ICD-10-CM

## 2018-06-12 DIAGNOSIS — R05 Cough: Secondary | ICD-10-CM

## 2018-06-12 DIAGNOSIS — K449 Diaphragmatic hernia without obstruction or gangrene: Secondary | ICD-10-CM

## 2018-06-12 DIAGNOSIS — R059 Cough, unspecified: Secondary | ICD-10-CM

## 2018-06-12 DIAGNOSIS — R140 Abdominal distension (gaseous): Secondary | ICD-10-CM

## 2018-06-12 MED ORDER — PLECANATIDE 3 MG PO TABS: 3.0000 mg | ORAL_TABLET | Freq: Every day | ORAL | 2 refills | Status: DC

## 2018-06-12 NOTE — Progress Notes (Signed)
IMPRESSION and PLAN:   #1. IBS with constipation. Failed linzess. Neg CT 02/08/2018. Neg colon except for mild diverticulosis, colon Bx- nonspecific focal resolving colitis 03/29/2018, neg EGD with SB Bx 05/2018 except for retained food, small hiatal hernia.  #2. Weight Loss -  Nl TSH. Neg colon 09/2015 except for small polyps. Neg EGD 05/2018 except for small HH, Schatzki's ring s/p dil.  #3. GERD with small HH. H/O dysphagia d/t Schatzki's ring s/p dil 05/2018 (resolved). Neg eso Bx for EoE, neg SB Bx for celiac.  #4. Cough (resolved) (has been on anti-allergy meds, neg chest Xray). Had recent antibiotics. Has chronic sinusitis (followed by Dr Ma/Dr Blenda Nicely)  #5. Abdominal bloating.  Has associated lactose intolerance.  Plan: - Solid phase GES (off reglan x 7 days).  - If still with problems, H2 breath test to r/o SIBO. - Protonix  po bid to continue. - Trulance  po qd #30, 2 refiils. - Miralax 17g po qd to continue. - Continue bentyl  QID prn. - Continue activia. - Minimize reglan (as it can cause extrapyramidal side effects) - FU after the above is complete in 6 weeks. Will obtain sitz marker study followed by ?anorectal manometry if needed at FU. - Small but frequent meals. - Check Wt every week and record. If continued wt loss, she needs to get in touch with [place] earlier.       HPI:    Chief Complaint:   Robin Hunter is a 68 y.o. female   Last thursday May 26 - got sick, diarrhea that night- stopped linzess, had syncope. Seen by Dr Welton Flakes, low BP d/t dehydration, had labs (awaited).   Currently, no N/V. Still with abdominal pain after eating.Still burps up. No sig dysphagia. Gettting bloated. Currently constipated.   Drinking plenty of water.  No melena or hematochezia.  CT scan Abdo/pelvis was negative.  Negative colonoscopy except for biopsies showing mild resolving colitis.  She had diarrhea before, now has more constipation.  Does have some  back problems.  Teaches water aerobics.  Lost 3-4lb more over 2-3 weeks. Past Medical History:  Diagnosis Date  . Abdominal distension   . Allergic rhinitis   . Allergy   . Anemia   . Cataract    bil cataracts removed  . CRI (chronic renal insufficiency), stage 2 (mild)   . Dysphagia   . Epigastric pain   . GERD (gastroesophageal reflux disease)   . Glaucoma   . History of colon polyps   . Hypercholesteremia   . Hypothyroidism   . IBS (irritable colon syndrome)    with Constipation  . Insomnia   . Migraine   . Osteoarthritis    hands,neck  . Sleep apnea    wears c-pap  . Weight loss     Current Outpatient Medications  Medication Sig Dispense Refill  . acetaZOLAMIDE (DIAMOX) 125 MG tablet Take 125 mg by mouth daily.     Marland Kitchen aspirin EC 81 MG tablet Take 81 mg by mouth daily.    . brimonidine-timolol (COMBIGAN) 0.2-0.5 % ophthalmic solution 1 drop 2 (two) times daily.     . butalbital-acetaminophen-caffeine (FIORICET WITH CODEINE) 50-325-40-30 MG capsule Take by mouth. As needed    . carisoprodol (SOMA) 350 MG tablet Take 350 mg by mouth at bedtime.     . celecoxib (CELEBREX) 200 MG capsule Take 200 mg by mouth daily.    . cholestyramine (QUESTRAN) 4 g packet USE 1 PACKET AS DIRECTED TWICE  A DAY (Patient taking differently: As needed) 60 packet 1  . Coenzyme Q10 (CO Q-10) 100 MG CAPS Take 100 mg by mouth daily.    Marland Kitchen dicyclomine (BENTYL) 10 MG capsule TAKE 1 OR 2 TABS EVERY 8 HOURS IF NEEDED FOR ABDOMINAL SPASMS 30 capsule 3  . estradiol (ESTRACE) 0.1 MG/GM vaginal cream 1 (ONE) GRAM THREE TIMES A WEEK AS NEEDED    . fluticasone (FLONASE) 50 MCG/ACT nasal spray 1 spray by Each Nare route daily.    Marland Kitchen gabapentin (NEURONTIN) 600 MG tablet Take 600 mg by mouth 3 (three) times daily.    . Galcanezumab-gnlm (EMGALITY) 120 MG/ML SOAJ Inject 120 mg into the skin every 30 (thirty) days.    Marland Kitchen imipramine (TOFRANIL) 50 MG tablet Take 50 mg by mouth at bedtime.    Marland Kitchen latanoprost (XALATAN)  0.005 % ophthalmic solution 1 drop daily.     Marland Kitchen levothyroxine (SYNTHROID, LEVOTHROID) 88 MCG tablet Take 88 mcg by mouth daily before breakfast.    . Magnesium 250 MG TABS Take by mouth daily.    . metoCLOPramide (REGLAN) 10 MG tablet Take 10 mg by mouth as needed for nausea.     . Multiple Vitamin (MULTI-VITAMIN DAILY) TABS Take by mouth daily.    . OnabotulinumtoxinA (BOTOX IJ) Inject as directed every 3 (three) months. headaches    . pantoprazole (PROTONIX) 40 MG tablet Take 1 tablet (40 mg total) by mouth 2 (two) times daily. 90 tablet 6  . polyethylene glycol (MIRALAX / GLYCOLAX) 17 g packet Take 17 g by mouth daily.    Marland Kitchen saccharomyces boulardii (FLORASTOR) 250 MG capsule Take 250 mg by mouth daily.    . vitamin C (ASCORBIC ACID) 500 MG tablet Take 500 mg by mouth daily.    Marland Kitchen linaclotide (LINZESS) 290 MCG CAPS capsule Take 1 capsule (290 mcg total) by mouth daily before breakfast for 30 days. (Patient not taking: Reported on 06/12/2018) 30 capsule 6  . linaclotide (LINZESS) 72 MCG capsule Take 1 capsule (72 mcg total) by mouth daily before breakfast. (Patient not taking: Reported on 06/12/2018) 30 capsule 6   No current facility-administered medications for this visit.     Past Surgical History:  Procedure Laterality Date  . CHOLECYSTECTOMY    . CHOLECYSTECTOMY, LAPAROSCOPIC  1992  . COLONOSCOPY  09/2016   polyps  . COLONOSCOPY  09/23/2015   small colonic polyp. mild sigmoid diverticulosis. BX: hyperplastic polyp  . COLONOSCOPY  01/20/2010   small colonic polyp, mild sigmoid diverticulosis, small internal hemorrhoids. BX: negative  . HUMERUS FRACTURE SURGERY     stainless steel rod placed  . NASAL SINUS SURGERY    . POLYPECTOMY    . TMJ ARTHROPLASTY  11/1999  . TRANSPHENOIDAL PITUITARY RESECTION  08/1998  . UPPER GASTROINTESTINAL ENDOSCOPY    . UPPER GI ENDOSCOPY  09/18/2013   schatzki ring . Small Hiatal Hernia. Torturous esophagus suggestive of esophageal dysmotility. BX: benign  squamous epithelim with focal basal cell hyperplasia and rare intraepitheal eosinophillis.    Family History  Problem Relation Age of Onset  . Diabetes Mother   . Diabetes Sister   . Irritable bowel syndrome Sister   . Breast cancer Maternal Aunt   . Colon cancer Neg Hx   . Esophageal cancer Neg Hx   . Rectal cancer Neg Hx   . Stomach cancer Neg Hx     Social History   Tobacco Use  . Smoking status: Never Smoker  . Smokeless tobacco: Never Used  Substance Use Topics  . Alcohol use: Not Currently  . Drug use: Never    Allergies  Allergen Reactions  . Levofloxacin     headache     Review of Systems: All systems reviewed and negative except where noted in HPI.    Physical Exam:     Ht 5\' 3"  (1.6 m)   Wt 110 lb (49.9 kg)   BMI 19.49 kg/m   Not performed since it was a tele-visit.  This service was provided via telemedicine.  The patient was located at home.  The provider was located in office.  The patient did consent to this tele-visit and is aware of possible charges through their insurance for this visit.   Time spent on call: 25 min     Renne Platts,MD 06/12/2018, 8:55 AM   CC Dr Welton FlakesKhan

## 2018-06-12 NOTE — Patient Instructions (Addendum)
If you are age 70 or older, your body mass index should be between 23-30. Your Body mass index is 19.49 kg/m. If this is out of the aforementioned range listed, please consider follow up with your Primary Care Provider.  If you are age 69 or younger, your body mass index should be between 19-25. Your Body mass index is 19.49 kg/m. If this is out of the aformentioned range listed, please consider follow up with your Primary Care Provider.   To help prevent the possible spread of infection to our patients, communities, and staff; we will be implementing the following measures:  As of now we are not allowing any visitors/family members to accompany you to any upcoming appointments with [facility]. If you have any concerns about this please contact our office to discuss prior to the appointment.   We have sent the following medications to your pharmacy for you to pick up at your convenience: Trulance 3mg  daily.  Continue taking Protonix 40mg  twice daily, Miralax daily, Bentyl 10mg  4 times daily as needed, and Activia.  Minimize your reglan use.  Eat small and frequent meals.  Check your weight daily and record it.  You have been scheduled for a gastric emptying scan at [facility] on 06/28/2018 at 7:30AM. Please arrive at least 15 minutes prior to your appointment for registration. Please make certain not to have anything to eat or drink after midnight the night before your test. Hold all stomach medications (ex: Zofran, phenergan, Reglan) FOR 7 DAYS prior to your test. If you need to reschedule your appointment, please contact radiology scheduling at [phone]. _____________________________________________________________________ A gastric-emptying study measures how long it takes for food to move through your stomach. There are several ways to measure stomach emptying. In the most common test, you eat food that contains a small amount of radioactive material. A scanner  that detects the movement of the radioactive material is placed over your abdomen to monitor the rate at which food leaves your stomach. This test normally takes about 4 hours to complete. _____________________________________________________________________    Thank you,  Dr. Lynann Bologna

## 2018-06-28 ENCOUNTER — Encounter (HOSPITAL_COMMUNITY)
Admission: RE | Admit: 2018-06-28 | Discharge: 2018-06-28 | Disposition: A | Discharge status: Home / Self Care | Payer: Medicare Other | Source: Ambulatory Visit | Attending: Gastroenterology | Admitting: Gastroenterology

## 2018-06-28 ENCOUNTER — Other Ambulatory Visit: Payer: Self-pay

## 2018-06-28 DIAGNOSIS — R634 Abnormal weight loss: Secondary | ICD-10-CM | POA: Diagnosis present

## 2018-06-28 DIAGNOSIS — K449 Diaphragmatic hernia without obstruction or gangrene: Secondary | ICD-10-CM | POA: Insufficient documentation

## 2018-06-28 DIAGNOSIS — R059 Cough, unspecified: Secondary | ICD-10-CM

## 2018-06-28 DIAGNOSIS — K581 Irritable bowel syndrome with constipation: Secondary | ICD-10-CM | POA: Insufficient documentation

## 2018-06-28 DIAGNOSIS — R05 Cough: Secondary | ICD-10-CM | POA: Diagnosis present

## 2018-06-28 DIAGNOSIS — K219 Gastro-esophageal reflux disease without esophagitis: Secondary | ICD-10-CM | POA: Insufficient documentation

## 2018-06-28 DIAGNOSIS — R140 Abdominal distension (gaseous): Secondary | ICD-10-CM | POA: Insufficient documentation

## 2018-06-28 MED ORDER — TECHNETIUM TC 99M SULFUR COLLOID
1.9000 | Freq: Once | INTRAVENOUS | Status: AC | PRN
  Administered 2018-06-28: 1.9 via INTRAVENOUS

## 2018-07-06 ENCOUNTER — Other Ambulatory Visit: Payer: Self-pay | Admitting: *Deleted

## 2018-07-06 ENCOUNTER — Telehealth: Payer: Self-pay | Admitting: Gastroenterology

## 2018-07-06 MED ORDER — METOCLOPRAMIDE HCL 10 MG PO TABS: 10.0000 mg | ORAL_TABLET | Freq: Four times a day (QID) | ORAL | 1 refills | Status: AC

## 2018-07-06 NOTE — Telephone Encounter (Signed)
I have reviewed the results.  It does show gastroparesis Plan: -Lets start Reglan 10 mg 4 times daily (half hour before each meal and bedtime) for 2 weeks, then drop it down to 10 mg p.o. twice daily (120, 1 refill).  Watch for any extrapyramidal side effects. -Also make appointment with Dr. Derrill Kay at [facility]. She has lost a lot of weight.  Thanks RG

## 2018-07-06 NOTE — Telephone Encounter (Signed)
Spoke to the patient, notified patient of GES results, sent new prescription to pharmacy. Faxed all information to the office of Dr. Scherrie November [phone]. Patient aware. Verbalized understanding.

## 2018-07-06 NOTE — Telephone Encounter (Signed)
The patient is inquiring about the results of her GES on 6/18. Please advise.

## 2018-07-06 NOTE — Telephone Encounter (Signed)
Pt called back for results from the procedure last thru.

## 2018-09-24 ENCOUNTER — Other Ambulatory Visit: Payer: Self-pay | Admitting: Gastroenterology

## 2018-10-23 ENCOUNTER — Other Ambulatory Visit: Payer: Self-pay | Admitting: Gastroenterology

## 2018-10-23 DIAGNOSIS — R197 Diarrhea, unspecified: Secondary | ICD-10-CM

## 2019-01-23 DIAGNOSIS — J301 Allergic rhinitis due to pollen: Secondary | ICD-10-CM | POA: Diagnosis not present

## 2019-01-25 DIAGNOSIS — H6123 Impacted cerumen, bilateral: Secondary | ICD-10-CM | POA: Diagnosis not present

## 2019-01-25 DIAGNOSIS — H6983 Other specified disorders of Eustachian tube, bilateral: Secondary | ICD-10-CM | POA: Diagnosis not present

## 2019-01-25 DIAGNOSIS — H9193 Unspecified hearing loss, bilateral: Secondary | ICD-10-CM | POA: Diagnosis not present

## 2019-01-25 DIAGNOSIS — Z8709 Personal history of other diseases of the respiratory system: Secondary | ICD-10-CM | POA: Diagnosis not present

## 2019-01-25 DIAGNOSIS — H61303 Acquired stenosis of external ear canal, unspecified, bilateral: Secondary | ICD-10-CM | POA: Diagnosis not present

## 2019-01-30 DIAGNOSIS — K6389 Other specified diseases of intestine: Secondary | ICD-10-CM | POA: Diagnosis not present

## 2019-02-20 DIAGNOSIS — J3489 Other specified disorders of nose and nasal sinuses: Secondary | ICD-10-CM | POA: Diagnosis not present

## 2019-02-20 DIAGNOSIS — Z9622 Myringotomy tube(s) status: Secondary | ICD-10-CM | POA: Diagnosis not present

## 2019-02-20 DIAGNOSIS — J0191 Acute recurrent sinusitis, unspecified: Secondary | ICD-10-CM | POA: Diagnosis not present

## 2019-02-20 DIAGNOSIS — J301 Allergic rhinitis due to pollen: Secondary | ICD-10-CM | POA: Diagnosis not present

## 2019-02-26 DIAGNOSIS — G43719 Chronic migraine without aura, intractable, without status migrainosus: Secondary | ICD-10-CM | POA: Diagnosis not present

## 2019-02-26 DIAGNOSIS — M542 Cervicalgia: Secondary | ICD-10-CM | POA: Diagnosis not present

## 2019-02-26 DIAGNOSIS — G5603 Carpal tunnel syndrome, bilateral upper limbs: Secondary | ICD-10-CM | POA: Diagnosis not present

## 2019-02-26 DIAGNOSIS — M5481 Occipital neuralgia: Secondary | ICD-10-CM | POA: Diagnosis not present

## 2019-03-06 DIAGNOSIS — E039 Hypothyroidism, unspecified: Secondary | ICD-10-CM | POA: Diagnosis not present

## 2019-03-06 DIAGNOSIS — Z0000 Encounter for general adult medical examination without abnormal findings: Secondary | ICD-10-CM | POA: Diagnosis not present

## 2019-03-06 DIAGNOSIS — E871 Hypo-osmolality and hyponatremia: Secondary | ICD-10-CM | POA: Diagnosis not present

## 2019-03-06 DIAGNOSIS — Z6820 Body mass index (BMI) 20.0-20.9, adult: Secondary | ICD-10-CM | POA: Diagnosis not present

## 2019-03-06 DIAGNOSIS — Z79899 Other long term (current) drug therapy: Secondary | ICD-10-CM | POA: Diagnosis not present

## 2019-03-06 DIAGNOSIS — E7800 Pure hypercholesterolemia, unspecified: Secondary | ICD-10-CM | POA: Diagnosis not present

## 2019-03-06 DIAGNOSIS — Z23 Encounter for immunization: Secondary | ICD-10-CM | POA: Diagnosis not present

## 2019-03-06 DIAGNOSIS — Z1331 Encounter for screening for depression: Secondary | ICD-10-CM | POA: Diagnosis not present

## 2019-03-08 DIAGNOSIS — H4423 Degenerative myopia, bilateral: Secondary | ICD-10-CM | POA: Diagnosis not present

## 2019-03-08 DIAGNOSIS — H401132 Primary open-angle glaucoma, bilateral, moderate stage: Secondary | ICD-10-CM | POA: Diagnosis not present

## 2019-03-20 DIAGNOSIS — J301 Allergic rhinitis due to pollen: Secondary | ICD-10-CM | POA: Diagnosis not present

## 2019-03-21 DIAGNOSIS — G43711 Chronic migraine without aura, intractable, with status migrainosus: Secondary | ICD-10-CM | POA: Diagnosis not present

## 2019-03-22 DIAGNOSIS — G44229 Chronic tension-type headache, not intractable: Secondary | ICD-10-CM | POA: Diagnosis not present

## 2019-03-22 DIAGNOSIS — G43719 Chronic migraine without aura, intractable, without status migrainosus: Secondary | ICD-10-CM | POA: Diagnosis not present

## 2019-03-22 DIAGNOSIS — M542 Cervicalgia: Secondary | ICD-10-CM | POA: Diagnosis not present

## 2019-03-22 DIAGNOSIS — G5603 Carpal tunnel syndrome, bilateral upper limbs: Secondary | ICD-10-CM | POA: Diagnosis not present

## 2019-03-22 DIAGNOSIS — M5481 Occipital neuralgia: Secondary | ICD-10-CM | POA: Diagnosis not present

## 2019-03-24 ENCOUNTER — Other Ambulatory Visit: Payer: Self-pay | Admitting: Gastroenterology

## 2019-04-01 DIAGNOSIS — M25512 Pain in left shoulder: Secondary | ICD-10-CM | POA: Diagnosis not present

## 2019-04-02 DIAGNOSIS — G4733 Obstructive sleep apnea (adult) (pediatric): Secondary | ICD-10-CM | POA: Diagnosis not present

## 2019-04-11 DIAGNOSIS — H9193 Unspecified hearing loss, bilateral: Secondary | ICD-10-CM | POA: Diagnosis not present

## 2019-04-11 DIAGNOSIS — Z9622 Myringotomy tube(s) status: Secondary | ICD-10-CM | POA: Diagnosis not present

## 2019-04-11 DIAGNOSIS — H61303 Acquired stenosis of external ear canal, unspecified, bilateral: Secondary | ICD-10-CM | POA: Diagnosis not present

## 2019-04-11 DIAGNOSIS — J3489 Other specified disorders of nose and nasal sinuses: Secondary | ICD-10-CM | POA: Diagnosis not present

## 2019-04-11 DIAGNOSIS — J329 Chronic sinusitis, unspecified: Secondary | ICD-10-CM | POA: Diagnosis not present

## 2019-04-11 DIAGNOSIS — H9201 Otalgia, right ear: Secondary | ICD-10-CM | POA: Diagnosis not present

## 2019-04-11 DIAGNOSIS — Z9889 Other specified postprocedural states: Secondary | ICD-10-CM | POA: Diagnosis not present

## 2019-04-17 DIAGNOSIS — J301 Allergic rhinitis due to pollen: Secondary | ICD-10-CM | POA: Diagnosis not present

## 2019-05-01 DIAGNOSIS — Z79899 Other long term (current) drug therapy: Secondary | ICD-10-CM | POA: Diagnosis not present

## 2019-05-01 DIAGNOSIS — R140 Abdominal distension (gaseous): Secondary | ICD-10-CM | POA: Diagnosis not present

## 2019-05-01 DIAGNOSIS — K5900 Constipation, unspecified: Secondary | ICD-10-CM | POA: Diagnosis not present

## 2019-05-01 DIAGNOSIS — Z8719 Personal history of other diseases of the digestive system: Secondary | ICD-10-CM | POA: Diagnosis not present

## 2019-05-01 DIAGNOSIS — R109 Unspecified abdominal pain: Secondary | ICD-10-CM | POA: Diagnosis not present

## 2019-05-15 DIAGNOSIS — J301 Allergic rhinitis due to pollen: Secondary | ICD-10-CM | POA: Diagnosis not present

## 2019-05-16 DIAGNOSIS — S8391XA Sprain of unspecified site of right knee, initial encounter: Secondary | ICD-10-CM | POA: Diagnosis not present

## 2019-05-16 DIAGNOSIS — S82091A Other fracture of right patella, initial encounter for closed fracture: Secondary | ICD-10-CM | POA: Diagnosis not present

## 2019-05-17 DIAGNOSIS — S82001A Unspecified fracture of right patella, initial encounter for closed fracture: Secondary | ICD-10-CM | POA: Diagnosis not present

## 2019-05-17 DIAGNOSIS — S8991XA Unspecified injury of right lower leg, initial encounter: Secondary | ICD-10-CM | POA: Diagnosis not present

## 2019-05-28 DIAGNOSIS — G932 Benign intracranial hypertension: Secondary | ICD-10-CM | POA: Diagnosis not present

## 2019-05-28 DIAGNOSIS — G43719 Chronic migraine without aura, intractable, without status migrainosus: Secondary | ICD-10-CM | POA: Diagnosis not present

## 2019-05-28 DIAGNOSIS — M5481 Occipital neuralgia: Secondary | ICD-10-CM | POA: Diagnosis not present

## 2019-05-28 DIAGNOSIS — M542 Cervicalgia: Secondary | ICD-10-CM | POA: Diagnosis not present

## 2019-06-12 DIAGNOSIS — R5383 Other fatigue: Secondary | ICD-10-CM | POA: Diagnosis not present

## 2019-06-12 DIAGNOSIS — G4733 Obstructive sleep apnea (adult) (pediatric): Secondary | ICD-10-CM | POA: Diagnosis not present

## 2019-06-12 DIAGNOSIS — J301 Allergic rhinitis due to pollen: Secondary | ICD-10-CM | POA: Diagnosis not present

## 2019-06-13 DIAGNOSIS — H61303 Acquired stenosis of external ear canal, unspecified, bilateral: Secondary | ICD-10-CM | POA: Diagnosis not present

## 2019-06-13 DIAGNOSIS — J019 Acute sinusitis, unspecified: Secondary | ICD-10-CM | POA: Diagnosis not present

## 2019-06-13 DIAGNOSIS — J329 Chronic sinusitis, unspecified: Secondary | ICD-10-CM | POA: Diagnosis not present

## 2019-06-13 DIAGNOSIS — J3489 Other specified disorders of nose and nasal sinuses: Secondary | ICD-10-CM | POA: Diagnosis not present

## 2019-06-13 DIAGNOSIS — H6123 Impacted cerumen, bilateral: Secondary | ICD-10-CM | POA: Diagnosis not present

## 2019-06-13 DIAGNOSIS — Z9622 Myringotomy tube(s) status: Secondary | ICD-10-CM | POA: Diagnosis not present

## 2019-06-13 DIAGNOSIS — H9193 Unspecified hearing loss, bilateral: Secondary | ICD-10-CM | POA: Diagnosis not present

## 2019-06-14 DIAGNOSIS — S82001A Unspecified fracture of right patella, initial encounter for closed fracture: Secondary | ICD-10-CM | POA: Diagnosis not present

## 2019-06-19 DIAGNOSIS — J301 Allergic rhinitis due to pollen: Secondary | ICD-10-CM | POA: Diagnosis not present

## 2019-07-17 DIAGNOSIS — J301 Allergic rhinitis due to pollen: Secondary | ICD-10-CM | POA: Diagnosis not present

## 2019-07-24 DIAGNOSIS — Z1231 Encounter for screening mammogram for malignant neoplasm of breast: Secondary | ICD-10-CM | POA: Diagnosis not present

## 2019-07-25 ENCOUNTER — Other Ambulatory Visit: Payer: Self-pay | Admitting: Gastroenterology

## 2019-07-25 DIAGNOSIS — R197 Diarrhea, unspecified: Secondary | ICD-10-CM

## 2019-07-26 DIAGNOSIS — H04123 Dry eye syndrome of bilateral lacrimal glands: Secondary | ICD-10-CM | POA: Diagnosis not present

## 2019-07-26 DIAGNOSIS — H401132 Primary open-angle glaucoma, bilateral, moderate stage: Secondary | ICD-10-CM | POA: Diagnosis not present

## 2019-07-31 DIAGNOSIS — K5900 Constipation, unspecified: Secondary | ICD-10-CM | POA: Diagnosis not present

## 2019-07-31 DIAGNOSIS — Z79899 Other long term (current) drug therapy: Secondary | ICD-10-CM | POA: Diagnosis not present

## 2019-07-31 DIAGNOSIS — K6389 Other specified diseases of intestine: Secondary | ICD-10-CM | POA: Diagnosis not present

## 2019-07-31 DIAGNOSIS — K5909 Other constipation: Secondary | ICD-10-CM | POA: Diagnosis not present

## 2019-07-31 DIAGNOSIS — Z6823 Body mass index (BMI) 23.0-23.9, adult: Secondary | ICD-10-CM | POA: Diagnosis not present

## 2019-07-31 DIAGNOSIS — M8589 Other specified disorders of bone density and structure, multiple sites: Secondary | ICD-10-CM | POA: Diagnosis not present

## 2019-07-31 DIAGNOSIS — E7800 Pure hypercholesterolemia, unspecified: Secondary | ICD-10-CM | POA: Diagnosis not present

## 2019-08-06 DIAGNOSIS — J310 Chronic rhinitis: Secondary | ICD-10-CM | POA: Diagnosis not present

## 2019-08-06 DIAGNOSIS — H6982 Other specified disorders of Eustachian tube, left ear: Secondary | ICD-10-CM | POA: Diagnosis not present

## 2019-08-06 DIAGNOSIS — J329 Chronic sinusitis, unspecified: Secondary | ICD-10-CM | POA: Diagnosis not present

## 2019-08-06 DIAGNOSIS — H6592 Unspecified nonsuppurative otitis media, left ear: Secondary | ICD-10-CM | POA: Diagnosis not present

## 2019-08-06 DIAGNOSIS — Z9622 Myringotomy tube(s) status: Secondary | ICD-10-CM | POA: Diagnosis not present

## 2019-08-06 DIAGNOSIS — T162XXA Foreign body in left ear, initial encounter: Secondary | ICD-10-CM | POA: Diagnosis not present

## 2019-08-07 DIAGNOSIS — M25512 Pain in left shoulder: Secondary | ICD-10-CM | POA: Diagnosis not present

## 2019-08-14 DIAGNOSIS — J301 Allergic rhinitis due to pollen: Secondary | ICD-10-CM | POA: Diagnosis not present

## 2019-08-20 DIAGNOSIS — G44229 Chronic tension-type headache, not intractable: Secondary | ICD-10-CM | POA: Diagnosis not present

## 2019-08-20 DIAGNOSIS — M542 Cervicalgia: Secondary | ICD-10-CM | POA: Diagnosis not present

## 2019-08-23 DIAGNOSIS — G43909 Migraine, unspecified, not intractable, without status migrainosus: Secondary | ICD-10-CM | POA: Diagnosis not present

## 2019-08-23 DIAGNOSIS — H6982 Other specified disorders of Eustachian tube, left ear: Secondary | ICD-10-CM | POA: Diagnosis not present

## 2019-08-23 DIAGNOSIS — Z9622 Myringotomy tube(s) status: Secondary | ICD-10-CM | POA: Diagnosis not present

## 2019-08-23 DIAGNOSIS — H6592 Unspecified nonsuppurative otitis media, left ear: Secondary | ICD-10-CM | POA: Diagnosis not present

## 2019-08-23 DIAGNOSIS — Z8709 Personal history of other diseases of the respiratory system: Secondary | ICD-10-CM | POA: Diagnosis not present

## 2019-08-23 DIAGNOSIS — Z974 Presence of external hearing-aid: Secondary | ICD-10-CM | POA: Diagnosis not present

## 2019-08-23 DIAGNOSIS — H9193 Unspecified hearing loss, bilateral: Secondary | ICD-10-CM | POA: Diagnosis not present

## 2019-08-30 DIAGNOSIS — G4733 Obstructive sleep apnea (adult) (pediatric): Secondary | ICD-10-CM | POA: Diagnosis not present

## 2019-09-04 DIAGNOSIS — G43719 Chronic migraine without aura, intractable, without status migrainosus: Secondary | ICD-10-CM | POA: Diagnosis not present

## 2019-09-05 DIAGNOSIS — H527 Unspecified disorder of refraction: Secondary | ICD-10-CM | POA: Diagnosis not present

## 2019-09-18 DIAGNOSIS — G4733 Obstructive sleep apnea (adult) (pediatric): Secondary | ICD-10-CM | POA: Diagnosis not present

## 2019-09-18 DIAGNOSIS — R5383 Other fatigue: Secondary | ICD-10-CM | POA: Diagnosis not present

## 2019-09-18 DIAGNOSIS — J301 Allergic rhinitis due to pollen: Secondary | ICD-10-CM | POA: Diagnosis not present

## 2019-09-24 DIAGNOSIS — J209 Acute bronchitis, unspecified: Secondary | ICD-10-CM | POA: Diagnosis not present

## 2019-09-24 DIAGNOSIS — R05 Cough: Secondary | ICD-10-CM | POA: Diagnosis not present

## 2019-09-24 DIAGNOSIS — Z20828 Contact with and (suspected) exposure to other viral communicable diseases: Secondary | ICD-10-CM | POA: Diagnosis not present

## 2019-10-09 DIAGNOSIS — Z049 Encounter for examination and observation for unspecified reason: Secondary | ICD-10-CM | POA: Diagnosis not present

## 2019-10-09 DIAGNOSIS — Z79899 Other long term (current) drug therapy: Secondary | ICD-10-CM | POA: Diagnosis not present

## 2019-10-09 DIAGNOSIS — G43719 Chronic migraine without aura, intractable, without status migrainosus: Secondary | ICD-10-CM | POA: Diagnosis not present

## 2019-10-15 DIAGNOSIS — H61303 Acquired stenosis of external ear canal, unspecified, bilateral: Secondary | ICD-10-CM | POA: Diagnosis not present

## 2019-10-15 DIAGNOSIS — H6123 Impacted cerumen, bilateral: Secondary | ICD-10-CM | POA: Diagnosis not present

## 2019-10-15 DIAGNOSIS — H6982 Other specified disorders of Eustachian tube, left ear: Secondary | ICD-10-CM | POA: Diagnosis not present

## 2019-10-15 DIAGNOSIS — H6522 Chronic serous otitis media, left ear: Secondary | ICD-10-CM | POA: Diagnosis not present

## 2019-10-15 DIAGNOSIS — H9193 Unspecified hearing loss, bilateral: Secondary | ICD-10-CM | POA: Diagnosis not present

## 2019-10-15 DIAGNOSIS — Z8709 Personal history of other diseases of the respiratory system: Secondary | ICD-10-CM | POA: Diagnosis not present

## 2019-10-21 DIAGNOSIS — G518 Other disorders of facial nerve: Secondary | ICD-10-CM | POA: Diagnosis not present

## 2019-10-21 DIAGNOSIS — M791 Myalgia, unspecified site: Secondary | ICD-10-CM | POA: Diagnosis not present

## 2019-10-21 DIAGNOSIS — G43719 Chronic migraine without aura, intractable, without status migrainosus: Secondary | ICD-10-CM | POA: Diagnosis not present

## 2019-10-21 DIAGNOSIS — M542 Cervicalgia: Secondary | ICD-10-CM | POA: Diagnosis not present

## 2019-10-30 DIAGNOSIS — J301 Allergic rhinitis due to pollen: Secondary | ICD-10-CM | POA: Diagnosis not present

## 2019-11-06 DIAGNOSIS — G518 Other disorders of facial nerve: Secondary | ICD-10-CM | POA: Diagnosis not present

## 2019-11-06 DIAGNOSIS — M791 Myalgia, unspecified site: Secondary | ICD-10-CM | POA: Diagnosis not present

## 2019-11-06 DIAGNOSIS — G43719 Chronic migraine without aura, intractable, without status migrainosus: Secondary | ICD-10-CM | POA: Diagnosis not present

## 2019-11-06 DIAGNOSIS — M542 Cervicalgia: Secondary | ICD-10-CM | POA: Diagnosis not present

## 2019-11-18 DIAGNOSIS — J329 Chronic sinusitis, unspecified: Secondary | ICD-10-CM | POA: Diagnosis not present

## 2019-11-18 DIAGNOSIS — J3489 Other specified disorders of nose and nasal sinuses: Secondary | ICD-10-CM | POA: Diagnosis not present

## 2019-11-18 DIAGNOSIS — Z9622 Myringotomy tube(s) status: Secondary | ICD-10-CM | POA: Diagnosis not present

## 2019-11-20 DIAGNOSIS — J301 Allergic rhinitis due to pollen: Secondary | ICD-10-CM | POA: Diagnosis not present

## 2019-11-22 DIAGNOSIS — H401112 Primary open-angle glaucoma, right eye, moderate stage: Secondary | ICD-10-CM | POA: Diagnosis not present

## 2019-11-26 DIAGNOSIS — I6389 Other cerebral infarction: Secondary | ICD-10-CM | POA: Diagnosis not present

## 2019-11-26 DIAGNOSIS — I674 Hypertensive encephalopathy: Secondary | ICD-10-CM | POA: Diagnosis not present

## 2019-11-26 DIAGNOSIS — R1013 Epigastric pain: Secondary | ICD-10-CM | POA: Diagnosis not present

## 2019-11-26 DIAGNOSIS — R531 Weakness: Secondary | ICD-10-CM | POA: Diagnosis not present

## 2019-11-26 DIAGNOSIS — I10 Essential (primary) hypertension: Secondary | ICD-10-CM | POA: Diagnosis not present

## 2019-11-26 DIAGNOSIS — R0789 Other chest pain: Secondary | ICD-10-CM | POA: Diagnosis not present

## 2019-11-26 DIAGNOSIS — R410 Disorientation, unspecified: Secondary | ICD-10-CM | POA: Diagnosis not present

## 2019-11-26 DIAGNOSIS — R4781 Slurred speech: Secondary | ICD-10-CM | POA: Diagnosis not present

## 2019-11-26 DIAGNOSIS — E871 Hypo-osmolality and hyponatremia: Secondary | ICD-10-CM | POA: Diagnosis not present

## 2019-11-26 DIAGNOSIS — R1084 Generalized abdominal pain: Secondary | ICD-10-CM | POA: Diagnosis not present

## 2019-11-26 DIAGNOSIS — R1110 Vomiting, unspecified: Secondary | ICD-10-CM | POA: Diagnosis not present

## 2019-11-26 DIAGNOSIS — R079 Chest pain, unspecified: Secondary | ICD-10-CM | POA: Diagnosis not present

## 2019-11-26 DIAGNOSIS — G459 Transient cerebral ischemic attack, unspecified: Secondary | ICD-10-CM | POA: Diagnosis not present

## 2019-11-26 DIAGNOSIS — J320 Chronic maxillary sinusitis: Secondary | ICD-10-CM | POA: Diagnosis not present

## 2019-11-26 DIAGNOSIS — Z8669 Personal history of other diseases of the nervous system and sense organs: Secondary | ICD-10-CM | POA: Diagnosis not present

## 2019-11-26 DIAGNOSIS — R42 Dizziness and giddiness: Secondary | ICD-10-CM | POA: Diagnosis not present

## 2019-11-26 DIAGNOSIS — J322 Chronic ethmoidal sinusitis: Secondary | ICD-10-CM | POA: Diagnosis not present

## 2019-11-26 DIAGNOSIS — R109 Unspecified abdominal pain: Secondary | ICD-10-CM | POA: Diagnosis not present

## 2019-11-27 DIAGNOSIS — G459 Transient cerebral ischemic attack, unspecified: Secondary | ICD-10-CM | POA: Diagnosis not present

## 2019-11-27 DIAGNOSIS — I674 Hypertensive encephalopathy: Secondary | ICD-10-CM | POA: Diagnosis not present

## 2019-11-27 DIAGNOSIS — E871 Hypo-osmolality and hyponatremia: Secondary | ICD-10-CM | POA: Diagnosis not present

## 2019-11-28 DIAGNOSIS — G459 Transient cerebral ischemic attack, unspecified: Secondary | ICD-10-CM | POA: Diagnosis not present

## 2019-11-28 DIAGNOSIS — I674 Hypertensive encephalopathy: Secondary | ICD-10-CM | POA: Diagnosis not present

## 2019-11-28 DIAGNOSIS — E871 Hypo-osmolality and hyponatremia: Secondary | ICD-10-CM | POA: Diagnosis not present

## 2019-12-02 DIAGNOSIS — E871 Hypo-osmolality and hyponatremia: Secondary | ICD-10-CM | POA: Diagnosis not present

## 2019-12-04 DIAGNOSIS — E871 Hypo-osmolality and hyponatremia: Secondary | ICD-10-CM | POA: Diagnosis not present

## 2019-12-04 DIAGNOSIS — Z6823 Body mass index (BMI) 23.0-23.9, adult: Secondary | ICD-10-CM | POA: Diagnosis not present

## 2019-12-04 DIAGNOSIS — Z86018 Personal history of other benign neoplasm: Secondary | ICD-10-CM | POA: Diagnosis not present

## 2019-12-13 DIAGNOSIS — M1990 Unspecified osteoarthritis, unspecified site: Secondary | ICD-10-CM | POA: Diagnosis not present

## 2019-12-13 DIAGNOSIS — D649 Anemia, unspecified: Secondary | ICD-10-CM | POA: Diagnosis not present

## 2019-12-13 DIAGNOSIS — E871 Hypo-osmolality and hyponatremia: Secondary | ICD-10-CM | POA: Diagnosis not present

## 2019-12-14 DIAGNOSIS — M1990 Unspecified osteoarthritis, unspecified site: Secondary | ICD-10-CM | POA: Diagnosis not present

## 2019-12-14 DIAGNOSIS — D649 Anemia, unspecified: Secondary | ICD-10-CM | POA: Diagnosis not present

## 2019-12-14 DIAGNOSIS — E871 Hypo-osmolality and hyponatremia: Secondary | ICD-10-CM | POA: Diagnosis not present

## 2019-12-15 DIAGNOSIS — E871 Hypo-osmolality and hyponatremia: Secondary | ICD-10-CM | POA: Diagnosis not present

## 2019-12-15 DIAGNOSIS — D649 Anemia, unspecified: Secondary | ICD-10-CM | POA: Diagnosis not present

## 2019-12-15 DIAGNOSIS — M1990 Unspecified osteoarthritis, unspecified site: Secondary | ICD-10-CM | POA: Diagnosis not present

## 2019-12-16 DIAGNOSIS — M1990 Unspecified osteoarthritis, unspecified site: Secondary | ICD-10-CM | POA: Diagnosis not present

## 2019-12-16 DIAGNOSIS — D649 Anemia, unspecified: Secondary | ICD-10-CM | POA: Diagnosis not present

## 2019-12-16 DIAGNOSIS — E871 Hypo-osmolality and hyponatremia: Secondary | ICD-10-CM | POA: Diagnosis not present

## 2019-12-17 DIAGNOSIS — E871 Hypo-osmolality and hyponatremia: Secondary | ICD-10-CM | POA: Diagnosis not present

## 2019-12-17 DIAGNOSIS — D352 Benign neoplasm of pituitary gland: Secondary | ICD-10-CM | POA: Diagnosis not present

## 2019-12-18 DIAGNOSIS — E871 Hypo-osmolality and hyponatremia: Secondary | ICD-10-CM | POA: Diagnosis not present

## 2019-12-18 DIAGNOSIS — R531 Weakness: Secondary | ICD-10-CM | POA: Diagnosis not present

## 2019-12-18 DIAGNOSIS — N179 Acute kidney failure, unspecified: Secondary | ICD-10-CM | POA: Diagnosis not present

## 2019-12-18 DIAGNOSIS — M1990 Unspecified osteoarthritis, unspecified site: Secondary | ICD-10-CM | POA: Diagnosis not present

## 2019-12-19 DIAGNOSIS — E871 Hypo-osmolality and hyponatremia: Secondary | ICD-10-CM | POA: Diagnosis not present

## 2019-12-19 DIAGNOSIS — N179 Acute kidney failure, unspecified: Secondary | ICD-10-CM | POA: Diagnosis not present

## 2019-12-19 DIAGNOSIS — M1990 Unspecified osteoarthritis, unspecified site: Secondary | ICD-10-CM | POA: Diagnosis not present

## 2019-12-20 DIAGNOSIS — M1990 Unspecified osteoarthritis, unspecified site: Secondary | ICD-10-CM | POA: Diagnosis not present

## 2019-12-20 DIAGNOSIS — E871 Hypo-osmolality and hyponatremia: Secondary | ICD-10-CM | POA: Diagnosis not present

## 2019-12-20 DIAGNOSIS — N179 Acute kidney failure, unspecified: Secondary | ICD-10-CM | POA: Diagnosis not present

## 2019-12-21 DIAGNOSIS — E871 Hypo-osmolality and hyponatremia: Secondary | ICD-10-CM | POA: Diagnosis not present

## 2019-12-21 DIAGNOSIS — N179 Acute kidney failure, unspecified: Secondary | ICD-10-CM | POA: Diagnosis not present

## 2019-12-21 DIAGNOSIS — M1990 Unspecified osteoarthritis, unspecified site: Secondary | ICD-10-CM | POA: Diagnosis not present

## 2019-12-22 DIAGNOSIS — E871 Hypo-osmolality and hyponatremia: Secondary | ICD-10-CM | POA: Diagnosis not present

## 2019-12-22 DIAGNOSIS — M1990 Unspecified osteoarthritis, unspecified site: Secondary | ICD-10-CM | POA: Diagnosis not present

## 2019-12-22 DIAGNOSIS — N179 Acute kidney failure, unspecified: Secondary | ICD-10-CM | POA: Diagnosis not present

## 2019-12-23 DIAGNOSIS — E871 Hypo-osmolality and hyponatremia: Secondary | ICD-10-CM | POA: Diagnosis not present

## 2019-12-23 DIAGNOSIS — Z6823 Body mass index (BMI) 23.0-23.9, adult: Secondary | ICD-10-CM | POA: Diagnosis not present

## 2019-12-25 DIAGNOSIS — E871 Hypo-osmolality and hyponatremia: Secondary | ICD-10-CM | POA: Diagnosis not present

## 2019-12-25 DIAGNOSIS — E618 Deficiency of other specified nutrient elements: Secondary | ICD-10-CM | POA: Diagnosis not present

## 2019-12-25 DIAGNOSIS — E2740 Unspecified adrenocortical insufficiency: Secondary | ICD-10-CM | POA: Diagnosis not present

## 2019-12-25 DIAGNOSIS — E039 Hypothyroidism, unspecified: Secondary | ICD-10-CM | POA: Diagnosis not present

## 2019-12-26 DIAGNOSIS — K6389 Other specified diseases of intestine: Secondary | ICD-10-CM | POA: Diagnosis not present

## 2019-12-26 DIAGNOSIS — A049 Bacterial intestinal infection, unspecified: Secondary | ICD-10-CM | POA: Diagnosis not present

## 2019-12-27 DIAGNOSIS — E871 Hypo-osmolality and hyponatremia: Secondary | ICD-10-CM | POA: Diagnosis not present

## 2019-12-31 DIAGNOSIS — E871 Hypo-osmolality and hyponatremia: Secondary | ICD-10-CM | POA: Diagnosis not present

## 2020-01-06 DIAGNOSIS — E871 Hypo-osmolality and hyponatremia: Secondary | ICD-10-CM | POA: Diagnosis not present

## 2020-01-13 DIAGNOSIS — E871 Hypo-osmolality and hyponatremia: Secondary | ICD-10-CM | POA: Diagnosis not present

## 2020-01-14 DIAGNOSIS — Z23 Encounter for immunization: Secondary | ICD-10-CM | POA: Diagnosis not present

## 2020-01-21 DIAGNOSIS — E871 Hypo-osmolality and hyponatremia: Secondary | ICD-10-CM | POA: Diagnosis not present

## 2020-01-21 DIAGNOSIS — R5383 Other fatigue: Secondary | ICD-10-CM | POA: Diagnosis not present

## 2020-01-31 DIAGNOSIS — E871 Hypo-osmolality and hyponatremia: Secondary | ICD-10-CM | POA: Diagnosis not present

## 2020-02-03 DIAGNOSIS — K5900 Constipation, unspecified: Secondary | ICD-10-CM | POA: Diagnosis not present

## 2020-02-03 DIAGNOSIS — K9089 Other intestinal malabsorption: Secondary | ICD-10-CM | POA: Diagnosis not present

## 2020-02-03 DIAGNOSIS — K588 Other irritable bowel syndrome: Secondary | ICD-10-CM | POA: Diagnosis not present

## 2020-02-03 DIAGNOSIS — R140 Abdominal distension (gaseous): Secondary | ICD-10-CM | POA: Diagnosis not present

## 2020-02-03 DIAGNOSIS — Z79899 Other long term (current) drug therapy: Secondary | ICD-10-CM | POA: Diagnosis not present

## 2020-02-03 DIAGNOSIS — Z8619 Personal history of other infectious and parasitic diseases: Secondary | ICD-10-CM | POA: Diagnosis not present

## 2020-02-10 DIAGNOSIS — E871 Hypo-osmolality and hyponatremia: Secondary | ICD-10-CM | POA: Diagnosis not present

## 2020-02-13 DIAGNOSIS — K9089 Other intestinal malabsorption: Secondary | ICD-10-CM | POA: Diagnosis not present

## 2020-02-13 DIAGNOSIS — K581 Irritable bowel syndrome with constipation: Secondary | ICD-10-CM | POA: Diagnosis not present

## 2020-02-13 DIAGNOSIS — Z79899 Other long term (current) drug therapy: Secondary | ICD-10-CM | POA: Diagnosis not present

## 2020-02-20 DIAGNOSIS — J0100 Acute maxillary sinusitis, unspecified: Secondary | ICD-10-CM | POA: Diagnosis not present

## 2020-02-20 DIAGNOSIS — E871 Hypo-osmolality and hyponatremia: Secondary | ICD-10-CM | POA: Diagnosis not present

## 2020-02-26 DIAGNOSIS — G4733 Obstructive sleep apnea (adult) (pediatric): Secondary | ICD-10-CM | POA: Diagnosis not present

## 2020-02-26 DIAGNOSIS — J301 Allergic rhinitis due to pollen: Secondary | ICD-10-CM | POA: Diagnosis not present

## 2020-02-26 DIAGNOSIS — R5383 Other fatigue: Secondary | ICD-10-CM | POA: Diagnosis not present

## 2020-02-28 DIAGNOSIS — H401112 Primary open-angle glaucoma, right eye, moderate stage: Secondary | ICD-10-CM | POA: Diagnosis not present

## 2020-03-05 DIAGNOSIS — E871 Hypo-osmolality and hyponatremia: Secondary | ICD-10-CM | POA: Diagnosis not present

## 2020-03-10 DIAGNOSIS — M542 Cervicalgia: Secondary | ICD-10-CM | POA: Diagnosis not present

## 2020-03-10 DIAGNOSIS — G518 Other disorders of facial nerve: Secondary | ICD-10-CM | POA: Diagnosis not present

## 2020-03-10 DIAGNOSIS — M791 Myalgia, unspecified site: Secondary | ICD-10-CM | POA: Diagnosis not present

## 2020-03-10 DIAGNOSIS — G43719 Chronic migraine without aura, intractable, without status migrainosus: Secondary | ICD-10-CM | POA: Diagnosis not present

## 2020-03-20 DIAGNOSIS — J301 Allergic rhinitis due to pollen: Secondary | ICD-10-CM | POA: Diagnosis not present

## 2020-03-24 DIAGNOSIS — G518 Other disorders of facial nerve: Secondary | ICD-10-CM | POA: Diagnosis not present

## 2020-03-24 DIAGNOSIS — G43719 Chronic migraine without aura, intractable, without status migrainosus: Secondary | ICD-10-CM | POA: Diagnosis not present

## 2020-03-24 DIAGNOSIS — M791 Myalgia, unspecified site: Secondary | ICD-10-CM | POA: Diagnosis not present

## 2020-03-24 DIAGNOSIS — M542 Cervicalgia: Secondary | ICD-10-CM | POA: Diagnosis not present

## 2020-03-30 DIAGNOSIS — M79674 Pain in right toe(s): Secondary | ICD-10-CM | POA: Diagnosis not present

## 2020-03-30 DIAGNOSIS — S90221S Contusion of right lesser toe(s) with damage to nail, sequela: Secondary | ICD-10-CM | POA: Diagnosis not present

## 2020-04-02 DIAGNOSIS — E871 Hypo-osmolality and hyponatremia: Secondary | ICD-10-CM | POA: Diagnosis not present

## 2020-04-06 DIAGNOSIS — H6981 Other specified disorders of Eustachian tube, right ear: Secondary | ICD-10-CM | POA: Diagnosis not present

## 2020-04-06 DIAGNOSIS — J3489 Other specified disorders of nose and nasal sinuses: Secondary | ICD-10-CM | POA: Diagnosis not present

## 2020-04-06 DIAGNOSIS — H9191 Unspecified hearing loss, right ear: Secondary | ICD-10-CM | POA: Diagnosis not present

## 2020-04-06 DIAGNOSIS — J310 Chronic rhinitis: Secondary | ICD-10-CM | POA: Diagnosis not present

## 2020-04-06 DIAGNOSIS — J329 Chronic sinusitis, unspecified: Secondary | ICD-10-CM | POA: Diagnosis not present

## 2020-04-06 DIAGNOSIS — H61303 Acquired stenosis of external ear canal, unspecified, bilateral: Secondary | ICD-10-CM | POA: Diagnosis not present

## 2020-04-06 DIAGNOSIS — H6121 Impacted cerumen, right ear: Secondary | ICD-10-CM | POA: Diagnosis not present

## 2020-04-06 DIAGNOSIS — Z9889 Other specified postprocedural states: Secondary | ICD-10-CM | POA: Diagnosis not present

## 2020-04-06 DIAGNOSIS — Z974 Presence of external hearing-aid: Secondary | ICD-10-CM | POA: Diagnosis not present

## 2020-04-08 DIAGNOSIS — M542 Cervicalgia: Secondary | ICD-10-CM | POA: Diagnosis not present

## 2020-04-08 DIAGNOSIS — G518 Other disorders of facial nerve: Secondary | ICD-10-CM | POA: Diagnosis not present

## 2020-04-08 DIAGNOSIS — G43719 Chronic migraine without aura, intractable, without status migrainosus: Secondary | ICD-10-CM | POA: Diagnosis not present

## 2020-04-08 DIAGNOSIS — M791 Myalgia, unspecified site: Secondary | ICD-10-CM | POA: Diagnosis not present

## 2020-04-10 DIAGNOSIS — Z0000 Encounter for general adult medical examination without abnormal findings: Secondary | ICD-10-CM | POA: Diagnosis not present

## 2020-04-10 DIAGNOSIS — Z1331 Encounter for screening for depression: Secondary | ICD-10-CM | POA: Diagnosis not present

## 2020-04-10 DIAGNOSIS — Z6824 Body mass index (BMI) 24.0-24.9, adult: Secondary | ICD-10-CM | POA: Diagnosis not present

## 2020-04-10 DIAGNOSIS — M79605 Pain in left leg: Secondary | ICD-10-CM | POA: Diagnosis not present

## 2020-04-10 DIAGNOSIS — M79604 Pain in right leg: Secondary | ICD-10-CM | POA: Diagnosis not present

## 2020-04-14 DIAGNOSIS — E871 Hypo-osmolality and hyponatremia: Secondary | ICD-10-CM | POA: Diagnosis not present

## 2020-04-14 DIAGNOSIS — E039 Hypothyroidism, unspecified: Secondary | ICD-10-CM | POA: Diagnosis not present

## 2020-04-14 DIAGNOSIS — E2740 Unspecified adrenocortical insufficiency: Secondary | ICD-10-CM | POA: Diagnosis not present

## 2020-04-16 DIAGNOSIS — E039 Hypothyroidism, unspecified: Secondary | ICD-10-CM | POA: Diagnosis not present

## 2020-04-20 DIAGNOSIS — J329 Chronic sinusitis, unspecified: Secondary | ICD-10-CM | POA: Diagnosis not present

## 2020-04-20 DIAGNOSIS — H9193 Unspecified hearing loss, bilateral: Secondary | ICD-10-CM | POA: Diagnosis not present

## 2020-04-20 DIAGNOSIS — J310 Chronic rhinitis: Secondary | ICD-10-CM | POA: Diagnosis not present

## 2020-04-20 DIAGNOSIS — Z974 Presence of external hearing-aid: Secondary | ICD-10-CM | POA: Diagnosis not present

## 2020-04-20 DIAGNOSIS — Z9622 Myringotomy tube(s) status: Secondary | ICD-10-CM | POA: Diagnosis not present

## 2020-04-20 DIAGNOSIS — H6980 Other specified disorders of Eustachian tube, unspecified ear: Secondary | ICD-10-CM | POA: Diagnosis not present

## 2020-04-20 DIAGNOSIS — J3489 Other specified disorders of nose and nasal sinuses: Secondary | ICD-10-CM | POA: Diagnosis not present

## 2020-04-20 DIAGNOSIS — T161XXD Foreign body in right ear, subsequent encounter: Secondary | ICD-10-CM | POA: Diagnosis not present

## 2020-04-22 DIAGNOSIS — G518 Other disorders of facial nerve: Secondary | ICD-10-CM | POA: Diagnosis not present

## 2020-04-22 DIAGNOSIS — M791 Myalgia, unspecified site: Secondary | ICD-10-CM | POA: Diagnosis not present

## 2020-04-22 DIAGNOSIS — G43719 Chronic migraine without aura, intractable, without status migrainosus: Secondary | ICD-10-CM | POA: Diagnosis not present

## 2020-04-22 DIAGNOSIS — M542 Cervicalgia: Secondary | ICD-10-CM | POA: Diagnosis not present

## 2020-05-04 DIAGNOSIS — J329 Chronic sinusitis, unspecified: Secondary | ICD-10-CM | POA: Diagnosis not present

## 2020-05-04 DIAGNOSIS — J0191 Acute recurrent sinusitis, unspecified: Secondary | ICD-10-CM | POA: Diagnosis not present

## 2020-05-04 DIAGNOSIS — J3489 Other specified disorders of nose and nasal sinuses: Secondary | ICD-10-CM | POA: Diagnosis not present

## 2020-05-06 DIAGNOSIS — M791 Myalgia, unspecified site: Secondary | ICD-10-CM | POA: Diagnosis not present

## 2020-05-06 DIAGNOSIS — M542 Cervicalgia: Secondary | ICD-10-CM | POA: Diagnosis not present

## 2020-05-06 DIAGNOSIS — G43719 Chronic migraine without aura, intractable, without status migrainosus: Secondary | ICD-10-CM | POA: Diagnosis not present

## 2020-05-06 DIAGNOSIS — G518 Other disorders of facial nerve: Secondary | ICD-10-CM | POA: Diagnosis not present

## 2020-05-11 DIAGNOSIS — M79604 Pain in right leg: Secondary | ICD-10-CM | POA: Diagnosis not present

## 2020-05-11 DIAGNOSIS — E871 Hypo-osmolality and hyponatremia: Secondary | ICD-10-CM | POA: Diagnosis not present

## 2020-05-11 DIAGNOSIS — Z6824 Body mass index (BMI) 24.0-24.9, adult: Secondary | ICD-10-CM | POA: Diagnosis not present

## 2020-05-11 DIAGNOSIS — M79605 Pain in left leg: Secondary | ICD-10-CM | POA: Diagnosis not present

## 2020-05-20 DIAGNOSIS — G43719 Chronic migraine without aura, intractable, without status migrainosus: Secondary | ICD-10-CM | POA: Diagnosis not present

## 2020-05-20 DIAGNOSIS — M542 Cervicalgia: Secondary | ICD-10-CM | POA: Diagnosis not present

## 2020-05-20 DIAGNOSIS — G518 Other disorders of facial nerve: Secondary | ICD-10-CM | POA: Diagnosis not present

## 2020-05-20 DIAGNOSIS — M791 Myalgia, unspecified site: Secondary | ICD-10-CM | POA: Diagnosis not present

## 2020-06-09 DIAGNOSIS — M5136 Other intervertebral disc degeneration, lumbar region: Secondary | ICD-10-CM | POA: Diagnosis not present

## 2020-06-10 DIAGNOSIS — E871 Hypo-osmolality and hyponatremia: Secondary | ICD-10-CM | POA: Diagnosis not present

## 2020-07-01 DIAGNOSIS — M791 Myalgia, unspecified site: Secondary | ICD-10-CM | POA: Diagnosis not present

## 2020-07-01 DIAGNOSIS — G518 Other disorders of facial nerve: Secondary | ICD-10-CM | POA: Diagnosis not present

## 2020-07-01 DIAGNOSIS — G43719 Chronic migraine without aura, intractable, without status migrainosus: Secondary | ICD-10-CM | POA: Diagnosis not present

## 2020-07-01 DIAGNOSIS — M542 Cervicalgia: Secondary | ICD-10-CM | POA: Diagnosis not present

## 2020-07-22 DIAGNOSIS — E871 Hypo-osmolality and hyponatremia: Secondary | ICD-10-CM | POA: Diagnosis not present

## 2020-07-24 DIAGNOSIS — H401112 Primary open-angle glaucoma, right eye, moderate stage: Secondary | ICD-10-CM | POA: Diagnosis not present

## 2020-08-04 DIAGNOSIS — M5136 Other intervertebral disc degeneration, lumbar region: Secondary | ICD-10-CM | POA: Diagnosis not present

## 2020-08-05 DIAGNOSIS — H401122 Primary open-angle glaucoma, left eye, moderate stage: Secondary | ICD-10-CM | POA: Diagnosis not present

## 2020-08-13 DIAGNOSIS — Z1231 Encounter for screening mammogram for malignant neoplasm of breast: Secondary | ICD-10-CM | POA: Diagnosis not present

## 2020-08-14 DIAGNOSIS — H401122 Primary open-angle glaucoma, left eye, moderate stage: Secondary | ICD-10-CM | POA: Diagnosis not present

## 2020-08-17 DIAGNOSIS — R5383 Other fatigue: Secondary | ICD-10-CM | POA: Diagnosis not present

## 2020-08-17 DIAGNOSIS — G4733 Obstructive sleep apnea (adult) (pediatric): Secondary | ICD-10-CM | POA: Diagnosis not present

## 2020-08-17 DIAGNOSIS — J301 Allergic rhinitis due to pollen: Secondary | ICD-10-CM | POA: Diagnosis not present

## 2020-08-19 DIAGNOSIS — M791 Myalgia, unspecified site: Secondary | ICD-10-CM | POA: Diagnosis not present

## 2020-08-19 DIAGNOSIS — G43719 Chronic migraine without aura, intractable, without status migrainosus: Secondary | ICD-10-CM | POA: Diagnosis not present

## 2020-08-19 DIAGNOSIS — G518 Other disorders of facial nerve: Secondary | ICD-10-CM | POA: Diagnosis not present

## 2020-08-19 DIAGNOSIS — M542 Cervicalgia: Secondary | ICD-10-CM | POA: Diagnosis not present

## 2020-08-28 DIAGNOSIS — E871 Hypo-osmolality and hyponatremia: Secondary | ICD-10-CM | POA: Diagnosis not present

## 2020-09-04 DIAGNOSIS — H401122 Primary open-angle glaucoma, left eye, moderate stage: Secondary | ICD-10-CM | POA: Diagnosis not present

## 2020-09-25 DIAGNOSIS — H401112 Primary open-angle glaucoma, right eye, moderate stage: Secondary | ICD-10-CM | POA: Diagnosis not present

## 2020-09-30 DIAGNOSIS — G518 Other disorders of facial nerve: Secondary | ICD-10-CM | POA: Diagnosis not present

## 2020-09-30 DIAGNOSIS — M542 Cervicalgia: Secondary | ICD-10-CM | POA: Diagnosis not present

## 2020-09-30 DIAGNOSIS — G43719 Chronic migraine without aura, intractable, without status migrainosus: Secondary | ICD-10-CM | POA: Diagnosis not present

## 2020-09-30 DIAGNOSIS — M791 Myalgia, unspecified site: Secondary | ICD-10-CM | POA: Diagnosis not present

## 2020-10-05 DIAGNOSIS — E871 Hypo-osmolality and hyponatremia: Secondary | ICD-10-CM | POA: Diagnosis not present

## 2020-10-20 DIAGNOSIS — H61303 Acquired stenosis of external ear canal, unspecified, bilateral: Secondary | ICD-10-CM | POA: Diagnosis not present

## 2020-10-20 DIAGNOSIS — H6123 Impacted cerumen, bilateral: Secondary | ICD-10-CM | POA: Diagnosis not present

## 2020-10-20 DIAGNOSIS — Z8709 Personal history of other diseases of the respiratory system: Secondary | ICD-10-CM | POA: Diagnosis not present

## 2020-10-20 DIAGNOSIS — H6982 Other specified disorders of Eustachian tube, left ear: Secondary | ICD-10-CM | POA: Diagnosis not present

## 2020-10-20 DIAGNOSIS — H9193 Unspecified hearing loss, bilateral: Secondary | ICD-10-CM | POA: Diagnosis not present

## 2020-10-30 DIAGNOSIS — J0100 Acute maxillary sinusitis, unspecified: Secondary | ICD-10-CM | POA: Diagnosis not present

## 2020-11-06 DIAGNOSIS — H401132 Primary open-angle glaucoma, bilateral, moderate stage: Secondary | ICD-10-CM | POA: Diagnosis not present

## 2020-11-09 DIAGNOSIS — J329 Chronic sinusitis, unspecified: Secondary | ICD-10-CM | POA: Diagnosis not present

## 2020-11-09 DIAGNOSIS — J3489 Other specified disorders of nose and nasal sinuses: Secondary | ICD-10-CM | POA: Diagnosis not present

## 2020-11-09 DIAGNOSIS — J0191 Acute recurrent sinusitis, unspecified: Secondary | ICD-10-CM | POA: Diagnosis not present

## 2020-11-09 DIAGNOSIS — Z9889 Other specified postprocedural states: Secondary | ICD-10-CM | POA: Diagnosis not present

## 2020-11-09 DIAGNOSIS — Z23 Encounter for immunization: Secondary | ICD-10-CM | POA: Diagnosis not present

## 2020-11-25 DIAGNOSIS — G43719 Chronic migraine without aura, intractable, without status migrainosus: Secondary | ICD-10-CM | POA: Diagnosis not present

## 2020-11-25 DIAGNOSIS — M542 Cervicalgia: Secondary | ICD-10-CM | POA: Diagnosis not present

## 2020-11-25 DIAGNOSIS — M791 Myalgia, unspecified site: Secondary | ICD-10-CM | POA: Diagnosis not present

## 2020-11-25 DIAGNOSIS — G518 Other disorders of facial nerve: Secondary | ICD-10-CM | POA: Diagnosis not present

## 2020-11-30 DIAGNOSIS — M25512 Pain in left shoulder: Secondary | ICD-10-CM | POA: Diagnosis not present

## 2020-11-30 DIAGNOSIS — M19012 Primary osteoarthritis, left shoulder: Secondary | ICD-10-CM | POA: Diagnosis not present

## 2020-11-30 DIAGNOSIS — M7552 Bursitis of left shoulder: Secondary | ICD-10-CM | POA: Diagnosis not present

## 2020-12-07 DIAGNOSIS — E871 Hypo-osmolality and hyponatremia: Secondary | ICD-10-CM | POA: Diagnosis not present

## 2020-12-09 DIAGNOSIS — M25512 Pain in left shoulder: Secondary | ICD-10-CM | POA: Diagnosis not present

## 2020-12-09 DIAGNOSIS — S42202A Unspecified fracture of upper end of left humerus, initial encounter for closed fracture: Secondary | ICD-10-CM | POA: Diagnosis not present

## 2020-12-09 DIAGNOSIS — M19012 Primary osteoarthritis, left shoulder: Secondary | ICD-10-CM | POA: Diagnosis not present

## 2020-12-15 DIAGNOSIS — H61303 Acquired stenosis of external ear canal, unspecified, bilateral: Secondary | ICD-10-CM | POA: Diagnosis not present

## 2020-12-15 DIAGNOSIS — J0191 Acute recurrent sinusitis, unspecified: Secondary | ICD-10-CM | POA: Diagnosis not present

## 2020-12-15 DIAGNOSIS — H6122 Impacted cerumen, left ear: Secondary | ICD-10-CM | POA: Diagnosis not present

## 2020-12-15 DIAGNOSIS — H9193 Unspecified hearing loss, bilateral: Secondary | ICD-10-CM | POA: Diagnosis not present

## 2020-12-15 DIAGNOSIS — J329 Chronic sinusitis, unspecified: Secondary | ICD-10-CM | POA: Diagnosis not present

## 2020-12-15 DIAGNOSIS — J310 Chronic rhinitis: Secondary | ICD-10-CM | POA: Diagnosis not present

## 2020-12-18 DIAGNOSIS — L03011 Cellulitis of right finger: Secondary | ICD-10-CM | POA: Diagnosis not present

## 2020-12-18 DIAGNOSIS — Z6826 Body mass index (BMI) 26.0-26.9, adult: Secondary | ICD-10-CM | POA: Diagnosis not present

## 2020-12-21 DIAGNOSIS — M25512 Pain in left shoulder: Secondary | ICD-10-CM | POA: Diagnosis not present

## 2020-12-21 DIAGNOSIS — M19012 Primary osteoarthritis, left shoulder: Secondary | ICD-10-CM | POA: Diagnosis not present

## 2021-01-06 DIAGNOSIS — G518 Other disorders of facial nerve: Secondary | ICD-10-CM | POA: Diagnosis not present

## 2021-01-06 DIAGNOSIS — M542 Cervicalgia: Secondary | ICD-10-CM | POA: Diagnosis not present

## 2021-01-06 DIAGNOSIS — M791 Myalgia, unspecified site: Secondary | ICD-10-CM | POA: Diagnosis not present

## 2021-01-06 DIAGNOSIS — G43719 Chronic migraine without aura, intractable, without status migrainosus: Secondary | ICD-10-CM | POA: Diagnosis not present

## 2021-01-15 DIAGNOSIS — E039 Hypothyroidism, unspecified: Secondary | ICD-10-CM | POA: Diagnosis not present

## 2021-01-15 DIAGNOSIS — E871 Hypo-osmolality and hyponatremia: Secondary | ICD-10-CM | POA: Diagnosis not present

## 2021-01-28 DIAGNOSIS — Z9622 Myringotomy tube(s) status: Secondary | ICD-10-CM | POA: Diagnosis not present

## 2021-01-28 DIAGNOSIS — J329 Chronic sinusitis, unspecified: Secondary | ICD-10-CM | POA: Diagnosis not present

## 2021-01-28 DIAGNOSIS — H6121 Impacted cerumen, right ear: Secondary | ICD-10-CM | POA: Diagnosis not present

## 2021-01-28 DIAGNOSIS — H61303 Acquired stenosis of external ear canal, unspecified, bilateral: Secondary | ICD-10-CM | POA: Diagnosis not present

## 2021-01-28 DIAGNOSIS — J3489 Other specified disorders of nose and nasal sinuses: Secondary | ICD-10-CM | POA: Diagnosis not present

## 2021-01-28 DIAGNOSIS — J310 Chronic rhinitis: Secondary | ICD-10-CM | POA: Diagnosis not present

## 2021-02-02 DIAGNOSIS — G43719 Chronic migraine without aura, intractable, without status migrainosus: Secondary | ICD-10-CM | POA: Diagnosis not present

## 2021-02-02 DIAGNOSIS — M542 Cervicalgia: Secondary | ICD-10-CM | POA: Diagnosis not present

## 2021-02-02 DIAGNOSIS — M791 Myalgia, unspecified site: Secondary | ICD-10-CM | POA: Diagnosis not present

## 2021-02-02 DIAGNOSIS — G518 Other disorders of facial nerve: Secondary | ICD-10-CM | POA: Diagnosis not present

## 2021-02-06 DIAGNOSIS — R059 Cough, unspecified: Secondary | ICD-10-CM | POA: Diagnosis not present

## 2021-02-06 DIAGNOSIS — U071 COVID-19: Secondary | ICD-10-CM | POA: Diagnosis not present

## 2021-02-06 DIAGNOSIS — R0981 Nasal congestion: Secondary | ICD-10-CM | POA: Diagnosis not present

## 2021-02-15 DIAGNOSIS — J0191 Acute recurrent sinusitis, unspecified: Secondary | ICD-10-CM | POA: Diagnosis not present

## 2021-02-15 DIAGNOSIS — J329 Chronic sinusitis, unspecified: Secondary | ICD-10-CM | POA: Diagnosis not present

## 2021-02-15 DIAGNOSIS — J310 Chronic rhinitis: Secondary | ICD-10-CM | POA: Diagnosis not present

## 2021-02-15 DIAGNOSIS — R5383 Other fatigue: Secondary | ICD-10-CM | POA: Diagnosis not present

## 2021-02-15 DIAGNOSIS — Z8616 Personal history of COVID-19: Secondary | ICD-10-CM | POA: Diagnosis not present

## 2021-02-15 DIAGNOSIS — G4733 Obstructive sleep apnea (adult) (pediatric): Secondary | ICD-10-CM | POA: Diagnosis not present

## 2021-02-15 DIAGNOSIS — J301 Allergic rhinitis due to pollen: Secondary | ICD-10-CM | POA: Diagnosis not present

## 2021-02-25 DIAGNOSIS — Z9622 Myringotomy tube(s) status: Secondary | ICD-10-CM | POA: Diagnosis not present

## 2021-02-25 DIAGNOSIS — H61303 Acquired stenosis of external ear canal, unspecified, bilateral: Secondary | ICD-10-CM | POA: Diagnosis not present

## 2021-02-26 DIAGNOSIS — H04123 Dry eye syndrome of bilateral lacrimal glands: Secondary | ICD-10-CM | POA: Diagnosis not present

## 2021-02-26 DIAGNOSIS — H401132 Primary open-angle glaucoma, bilateral, moderate stage: Secondary | ICD-10-CM | POA: Diagnosis not present

## 2021-03-01 DIAGNOSIS — G43719 Chronic migraine without aura, intractable, without status migrainosus: Secondary | ICD-10-CM | POA: Diagnosis not present

## 2021-03-01 DIAGNOSIS — M791 Myalgia, unspecified site: Secondary | ICD-10-CM | POA: Diagnosis not present

## 2021-03-01 DIAGNOSIS — G518 Other disorders of facial nerve: Secondary | ICD-10-CM | POA: Diagnosis not present

## 2021-03-01 DIAGNOSIS — M542 Cervicalgia: Secondary | ICD-10-CM | POA: Diagnosis not present

## 2021-03-17 DIAGNOSIS — E871 Hypo-osmolality and hyponatremia: Secondary | ICD-10-CM | POA: Diagnosis not present

## 2021-03-25 DIAGNOSIS — M5136 Other intervertebral disc degeneration, lumbar region: Secondary | ICD-10-CM | POA: Diagnosis not present

## 2021-03-29 DIAGNOSIS — G43719 Chronic migraine without aura, intractable, without status migrainosus: Secondary | ICD-10-CM | POA: Diagnosis not present

## 2021-03-29 DIAGNOSIS — G518 Other disorders of facial nerve: Secondary | ICD-10-CM | POA: Diagnosis not present

## 2021-03-29 DIAGNOSIS — M791 Myalgia, unspecified site: Secondary | ICD-10-CM | POA: Diagnosis not present

## 2021-03-29 DIAGNOSIS — M542 Cervicalgia: Secondary | ICD-10-CM | POA: Diagnosis not present

## 2021-04-20 DIAGNOSIS — H9319 Tinnitus, unspecified ear: Secondary | ICD-10-CM | POA: Diagnosis not present

## 2021-04-20 DIAGNOSIS — H6123 Impacted cerumen, bilateral: Secondary | ICD-10-CM | POA: Diagnosis not present

## 2021-04-20 DIAGNOSIS — H6591 Unspecified nonsuppurative otitis media, right ear: Secondary | ICD-10-CM | POA: Diagnosis not present

## 2021-04-20 DIAGNOSIS — J329 Chronic sinusitis, unspecified: Secondary | ICD-10-CM | POA: Diagnosis not present

## 2021-04-20 DIAGNOSIS — H6981 Other specified disorders of Eustachian tube, right ear: Secondary | ICD-10-CM | POA: Diagnosis not present

## 2021-04-20 DIAGNOSIS — H61303 Acquired stenosis of external ear canal, unspecified, bilateral: Secondary | ICD-10-CM | POA: Diagnosis not present

## 2021-04-20 DIAGNOSIS — Z9622 Myringotomy tube(s) status: Secondary | ICD-10-CM | POA: Diagnosis not present

## 2021-04-20 DIAGNOSIS — H9190 Unspecified hearing loss, unspecified ear: Secondary | ICD-10-CM | POA: Diagnosis not present

## 2021-04-22 DIAGNOSIS — M791 Myalgia, unspecified site: Secondary | ICD-10-CM | POA: Diagnosis not present

## 2021-04-22 DIAGNOSIS — G518 Other disorders of facial nerve: Secondary | ICD-10-CM | POA: Diagnosis not present

## 2021-04-22 DIAGNOSIS — M542 Cervicalgia: Secondary | ICD-10-CM | POA: Diagnosis not present

## 2021-04-22 DIAGNOSIS — G43719 Chronic migraine without aura, intractable, without status migrainosus: Secondary | ICD-10-CM | POA: Diagnosis not present

## 2021-04-23 DIAGNOSIS — M810 Age-related osteoporosis without current pathological fracture: Secondary | ICD-10-CM | POA: Diagnosis not present

## 2021-04-23 DIAGNOSIS — Z1331 Encounter for screening for depression: Secondary | ICD-10-CM | POA: Diagnosis not present

## 2021-04-23 DIAGNOSIS — E7800 Pure hypercholesterolemia, unspecified: Secondary | ICD-10-CM | POA: Diagnosis not present

## 2021-04-23 DIAGNOSIS — Z9181 History of falling: Secondary | ICD-10-CM | POA: Diagnosis not present

## 2021-04-23 DIAGNOSIS — Z0000 Encounter for general adult medical examination without abnormal findings: Secondary | ICD-10-CM | POA: Diagnosis not present

## 2021-04-23 DIAGNOSIS — Z79899 Other long term (current) drug therapy: Secondary | ICD-10-CM | POA: Diagnosis not present

## 2021-04-23 DIAGNOSIS — E039 Hypothyroidism, unspecified: Secondary | ICD-10-CM | POA: Diagnosis not present

## 2021-04-23 DIAGNOSIS — Z6826 Body mass index (BMI) 26.0-26.9, adult: Secondary | ICD-10-CM | POA: Diagnosis not present

## 2021-04-23 DIAGNOSIS — E871 Hypo-osmolality and hyponatremia: Secondary | ICD-10-CM | POA: Diagnosis not present

## 2021-05-05 DIAGNOSIS — M542 Cervicalgia: Secondary | ICD-10-CM | POA: Diagnosis not present

## 2021-05-05 DIAGNOSIS — G518 Other disorders of facial nerve: Secondary | ICD-10-CM | POA: Diagnosis not present

## 2021-05-05 DIAGNOSIS — G43719 Chronic migraine without aura, intractable, without status migrainosus: Secondary | ICD-10-CM | POA: Diagnosis not present

## 2021-05-05 DIAGNOSIS — M791 Myalgia, unspecified site: Secondary | ICD-10-CM | POA: Diagnosis not present

## 2021-05-10 DIAGNOSIS — R5383 Other fatigue: Secondary | ICD-10-CM | POA: Diagnosis not present

## 2021-05-10 DIAGNOSIS — G4733 Obstructive sleep apnea (adult) (pediatric): Secondary | ICD-10-CM | POA: Diagnosis not present

## 2021-05-10 DIAGNOSIS — J301 Allergic rhinitis due to pollen: Secondary | ICD-10-CM | POA: Diagnosis not present

## 2021-05-20 DIAGNOSIS — Z9622 Myringotomy tube(s) status: Secondary | ICD-10-CM | POA: Diagnosis not present

## 2021-05-20 DIAGNOSIS — H6981 Other specified disorders of Eustachian tube, right ear: Secondary | ICD-10-CM | POA: Diagnosis not present

## 2021-05-20 DIAGNOSIS — H9190 Unspecified hearing loss, unspecified ear: Secondary | ICD-10-CM | POA: Diagnosis not present

## 2021-06-03 DIAGNOSIS — G43719 Chronic migraine without aura, intractable, without status migrainosus: Secondary | ICD-10-CM | POA: Diagnosis not present

## 2021-06-03 DIAGNOSIS — M542 Cervicalgia: Secondary | ICD-10-CM | POA: Diagnosis not present

## 2021-06-03 DIAGNOSIS — G518 Other disorders of facial nerve: Secondary | ICD-10-CM | POA: Diagnosis not present

## 2021-06-03 DIAGNOSIS — M791 Myalgia, unspecified site: Secondary | ICD-10-CM | POA: Diagnosis not present

## 2021-06-08 DIAGNOSIS — M79604 Pain in right leg: Secondary | ICD-10-CM | POA: Diagnosis not present

## 2021-06-08 DIAGNOSIS — M79605 Pain in left leg: Secondary | ICD-10-CM | POA: Diagnosis not present

## 2021-06-08 DIAGNOSIS — Z6826 Body mass index (BMI) 26.0-26.9, adult: Secondary | ICD-10-CM | POA: Diagnosis not present

## 2021-06-08 DIAGNOSIS — E871 Hypo-osmolality and hyponatremia: Secondary | ICD-10-CM | POA: Diagnosis not present

## 2021-06-25 DIAGNOSIS — H401123 Primary open-angle glaucoma, left eye, severe stage: Secondary | ICD-10-CM | POA: Diagnosis not present

## 2021-06-30 DIAGNOSIS — M5136 Other intervertebral disc degeneration, lumbar region: Secondary | ICD-10-CM | POA: Diagnosis not present

## 2021-07-06 ENCOUNTER — Other Ambulatory Visit: Payer: Self-pay | Admitting: Physician Assistant

## 2021-07-06 DIAGNOSIS — M5440 Lumbago with sciatica, unspecified side: Secondary | ICD-10-CM

## 2021-07-07 ENCOUNTER — Other Ambulatory Visit: Payer: Self-pay | Admitting: Physician Assistant

## 2021-07-07 DIAGNOSIS — M545 Low back pain, unspecified: Secondary | ICD-10-CM

## 2021-07-09 DIAGNOSIS — J019 Acute sinusitis, unspecified: Secondary | ICD-10-CM | POA: Diagnosis not present

## 2021-07-09 DIAGNOSIS — Z9622 Myringotomy tube(s) status: Secondary | ICD-10-CM | POA: Diagnosis not present

## 2021-07-09 DIAGNOSIS — J329 Chronic sinusitis, unspecified: Secondary | ICD-10-CM | POA: Diagnosis not present

## 2021-07-18 ENCOUNTER — Ambulatory Visit
Admission: RE | Admit: 2021-07-18 | Discharge: 2021-07-18 | Disposition: A | Discharge status: Home / Self Care | Payer: Medicare PPO | Source: Ambulatory Visit | Attending: Physician Assistant | Admitting: Physician Assistant

## 2021-07-18 DIAGNOSIS — M48061 Spinal stenosis, lumbar region without neurogenic claudication: Secondary | ICD-10-CM | POA: Diagnosis not present

## 2021-07-18 DIAGNOSIS — M545 Low back pain, unspecified: Secondary | ICD-10-CM

## 2021-07-22 DIAGNOSIS — G43719 Chronic migraine without aura, intractable, without status migrainosus: Secondary | ICD-10-CM | POA: Diagnosis not present

## 2021-07-22 DIAGNOSIS — M542 Cervicalgia: Secondary | ICD-10-CM | POA: Diagnosis not present

## 2021-07-22 DIAGNOSIS — M791 Myalgia, unspecified site: Secondary | ICD-10-CM | POA: Diagnosis not present

## 2021-07-22 DIAGNOSIS — G518 Other disorders of facial nerve: Secondary | ICD-10-CM | POA: Diagnosis not present

## 2021-08-04 DIAGNOSIS — M4727 Other spondylosis with radiculopathy, lumbosacral region: Secondary | ICD-10-CM | POA: Diagnosis not present

## 2021-08-04 DIAGNOSIS — M5136 Other intervertebral disc degeneration, lumbar region: Secondary | ICD-10-CM | POA: Diagnosis not present

## 2021-08-09 DIAGNOSIS — E871 Hypo-osmolality and hyponatremia: Secondary | ICD-10-CM | POA: Diagnosis not present

## 2021-08-19 DIAGNOSIS — Z6826 Body mass index (BMI) 26.0-26.9, adult: Secondary | ICD-10-CM | POA: Diagnosis not present

## 2021-08-19 DIAGNOSIS — R300 Dysuria: Secondary | ICD-10-CM | POA: Diagnosis not present

## 2021-08-24 DIAGNOSIS — Z1231 Encounter for screening mammogram for malignant neoplasm of breast: Secondary | ICD-10-CM | POA: Diagnosis not present

## 2021-09-02 DIAGNOSIS — M791 Myalgia, unspecified site: Secondary | ICD-10-CM | POA: Diagnosis not present

## 2021-09-02 DIAGNOSIS — G43719 Chronic migraine without aura, intractable, without status migrainosus: Secondary | ICD-10-CM | POA: Diagnosis not present

## 2021-09-02 DIAGNOSIS — M542 Cervicalgia: Secondary | ICD-10-CM | POA: Diagnosis not present

## 2021-09-02 DIAGNOSIS — G518 Other disorders of facial nerve: Secondary | ICD-10-CM | POA: Diagnosis not present

## 2021-09-25 DIAGNOSIS — M791 Myalgia, unspecified site: Secondary | ICD-10-CM | POA: Diagnosis not present

## 2021-09-25 DIAGNOSIS — J324 Chronic pansinusitis: Secondary | ICD-10-CM | POA: Diagnosis not present

## 2021-09-25 DIAGNOSIS — R059 Cough, unspecified: Secondary | ICD-10-CM | POA: Diagnosis not present

## 2021-09-25 DIAGNOSIS — J029 Acute pharyngitis, unspecified: Secondary | ICD-10-CM | POA: Diagnosis not present

## 2021-10-06 DIAGNOSIS — H401132 Primary open-angle glaucoma, bilateral, moderate stage: Secondary | ICD-10-CM | POA: Diagnosis not present

## 2021-10-06 DIAGNOSIS — H524 Presbyopia: Secondary | ICD-10-CM | POA: Diagnosis not present

## 2021-10-11 DIAGNOSIS — Z23 Encounter for immunization: Secondary | ICD-10-CM | POA: Diagnosis not present

## 2021-10-11 DIAGNOSIS — E871 Hypo-osmolality and hyponatremia: Secondary | ICD-10-CM | POA: Diagnosis not present

## 2021-10-21 DIAGNOSIS — M791 Myalgia, unspecified site: Secondary | ICD-10-CM | POA: Diagnosis not present

## 2021-10-21 DIAGNOSIS — G518 Other disorders of facial nerve: Secondary | ICD-10-CM | POA: Diagnosis not present

## 2021-10-21 DIAGNOSIS — M542 Cervicalgia: Secondary | ICD-10-CM | POA: Diagnosis not present

## 2021-10-21 DIAGNOSIS — G43719 Chronic migraine without aura, intractable, without status migrainosus: Secondary | ICD-10-CM | POA: Diagnosis not present

## 2021-10-25 DIAGNOSIS — H9190 Unspecified hearing loss, unspecified ear: Secondary | ICD-10-CM | POA: Diagnosis not present

## 2021-10-25 DIAGNOSIS — H6122 Impacted cerumen, left ear: Secondary | ICD-10-CM | POA: Diagnosis not present

## 2021-10-25 DIAGNOSIS — Z9622 Myringotomy tube(s) status: Secondary | ICD-10-CM | POA: Diagnosis not present

## 2021-10-25 DIAGNOSIS — J310 Chronic rhinitis: Secondary | ICD-10-CM | POA: Diagnosis not present

## 2021-10-25 DIAGNOSIS — Z8709 Personal history of other diseases of the respiratory system: Secondary | ICD-10-CM | POA: Diagnosis not present

## 2021-10-27 DIAGNOSIS — Z23 Encounter for immunization: Secondary | ICD-10-CM | POA: Diagnosis not present

## 2021-11-05 DIAGNOSIS — Z6826 Body mass index (BMI) 26.0-26.9, adult: Secondary | ICD-10-CM | POA: Diagnosis not present

## 2021-11-05 DIAGNOSIS — R072 Precordial pain: Secondary | ICD-10-CM | POA: Diagnosis not present

## 2021-11-08 DIAGNOSIS — J301 Allergic rhinitis due to pollen: Secondary | ICD-10-CM | POA: Diagnosis not present

## 2021-11-08 DIAGNOSIS — R5383 Other fatigue: Secondary | ICD-10-CM | POA: Diagnosis not present

## 2021-11-08 DIAGNOSIS — G4733 Obstructive sleep apnea (adult) (pediatric): Secondary | ICD-10-CM | POA: Diagnosis not present

## 2021-11-17 DIAGNOSIS — Z6826 Body mass index (BMI) 26.0-26.9, adult: Secondary | ICD-10-CM | POA: Diagnosis not present

## 2021-11-17 DIAGNOSIS — K219 Gastro-esophageal reflux disease without esophagitis: Secondary | ICD-10-CM | POA: Diagnosis not present

## 2021-11-17 DIAGNOSIS — R1013 Epigastric pain: Secondary | ICD-10-CM | POA: Diagnosis not present

## 2021-11-24 DIAGNOSIS — K2100 Gastro-esophageal reflux disease with esophagitis, without bleeding: Secondary | ICD-10-CM | POA: Diagnosis not present

## 2021-11-24 DIAGNOSIS — R140 Abdominal distension (gaseous): Secondary | ICD-10-CM | POA: Diagnosis not present

## 2021-11-24 DIAGNOSIS — R1013 Epigastric pain: Secondary | ICD-10-CM | POA: Diagnosis not present

## 2021-11-24 DIAGNOSIS — Z79899 Other long term (current) drug therapy: Secondary | ICD-10-CM | POA: Diagnosis not present

## 2021-11-24 DIAGNOSIS — K219 Gastro-esophageal reflux disease without esophagitis: Secondary | ICD-10-CM | POA: Diagnosis not present

## 2021-11-29 DIAGNOSIS — R1013 Epigastric pain: Secondary | ICD-10-CM | POA: Diagnosis not present

## 2021-11-29 DIAGNOSIS — N200 Calculus of kidney: Secondary | ICD-10-CM | POA: Diagnosis not present

## 2021-11-29 DIAGNOSIS — R109 Unspecified abdominal pain: Secondary | ICD-10-CM | POA: Diagnosis not present

## 2021-11-30 DIAGNOSIS — M791 Myalgia, unspecified site: Secondary | ICD-10-CM | POA: Diagnosis not present

## 2021-11-30 DIAGNOSIS — G518 Other disorders of facial nerve: Secondary | ICD-10-CM | POA: Diagnosis not present

## 2021-11-30 DIAGNOSIS — M542 Cervicalgia: Secondary | ICD-10-CM | POA: Diagnosis not present

## 2021-11-30 DIAGNOSIS — G43719 Chronic migraine without aura, intractable, without status migrainosus: Secondary | ICD-10-CM | POA: Diagnosis not present

## 2021-12-06 DIAGNOSIS — K219 Gastro-esophageal reflux disease without esophagitis: Secondary | ICD-10-CM | POA: Diagnosis not present

## 2021-12-06 DIAGNOSIS — Z6826 Body mass index (BMI) 26.0-26.9, adult: Secondary | ICD-10-CM | POA: Diagnosis not present

## 2021-12-08 DIAGNOSIS — J329 Chronic sinusitis, unspecified: Secondary | ICD-10-CM | POA: Diagnosis not present

## 2021-12-08 DIAGNOSIS — H6990 Unspecified Eustachian tube disorder, unspecified ear: Secondary | ICD-10-CM | POA: Diagnosis not present

## 2021-12-17 DIAGNOSIS — H6592 Unspecified nonsuppurative otitis media, left ear: Secondary | ICD-10-CM | POA: Diagnosis not present

## 2021-12-17 DIAGNOSIS — H6993 Unspecified Eustachian tube disorder, bilateral: Secondary | ICD-10-CM | POA: Diagnosis not present

## 2021-12-17 DIAGNOSIS — H9190 Unspecified hearing loss, unspecified ear: Secondary | ICD-10-CM | POA: Diagnosis not present

## 2021-12-17 DIAGNOSIS — T162XXA Foreign body in left ear, initial encounter: Secondary | ICD-10-CM | POA: Diagnosis not present

## 2021-12-20 DIAGNOSIS — M5136 Other intervertebral disc degeneration, lumbar region: Secondary | ICD-10-CM | POA: Diagnosis not present

## 2021-12-29 DIAGNOSIS — H401132 Primary open-angle glaucoma, bilateral, moderate stage: Secondary | ICD-10-CM | POA: Diagnosis not present

## 2022-01-07 DIAGNOSIS — H6993 Unspecified Eustachian tube disorder, bilateral: Secondary | ICD-10-CM | POA: Diagnosis not present

## 2022-01-07 DIAGNOSIS — H6593 Unspecified nonsuppurative otitis media, bilateral: Secondary | ICD-10-CM | POA: Diagnosis not present

## 2022-01-07 DIAGNOSIS — H9193 Unspecified hearing loss, bilateral: Secondary | ICD-10-CM | POA: Diagnosis not present

## 2022-01-12 DIAGNOSIS — H6523 Chronic serous otitis media, bilateral: Secondary | ICD-10-CM | POA: Diagnosis not present

## 2022-01-13 DIAGNOSIS — G4733 Obstructive sleep apnea (adult) (pediatric): Secondary | ICD-10-CM | POA: Diagnosis not present

## 2022-01-14 DIAGNOSIS — J069 Acute upper respiratory infection, unspecified: Secondary | ICD-10-CM | POA: Diagnosis not present

## 2022-01-17 DIAGNOSIS — J329 Chronic sinusitis, unspecified: Secondary | ICD-10-CM | POA: Diagnosis not present

## 2022-01-22 DIAGNOSIS — R059 Cough, unspecified: Secondary | ICD-10-CM | POA: Diagnosis not present

## 2022-01-22 DIAGNOSIS — R112 Nausea with vomiting, unspecified: Secondary | ICD-10-CM | POA: Diagnosis not present

## 2022-01-22 DIAGNOSIS — Z20822 Contact with and (suspected) exposure to covid-19: Secondary | ICD-10-CM | POA: Diagnosis not present

## 2022-01-22 DIAGNOSIS — R0602 Shortness of breath: Secondary | ICD-10-CM | POA: Diagnosis not present

## 2022-01-22 DIAGNOSIS — R1110 Vomiting, unspecified: Secondary | ICD-10-CM | POA: Diagnosis not present

## 2022-01-22 DIAGNOSIS — J019 Acute sinusitis, unspecified: Secondary | ICD-10-CM | POA: Diagnosis not present

## 2022-01-22 DIAGNOSIS — J069 Acute upper respiratory infection, unspecified: Secondary | ICD-10-CM | POA: Diagnosis not present

## 2022-01-26 DIAGNOSIS — K219 Gastro-esophageal reflux disease without esophagitis: Secondary | ICD-10-CM | POA: Diagnosis not present

## 2022-01-26 DIAGNOSIS — R0689 Other abnormalities of breathing: Secondary | ICD-10-CM | POA: Diagnosis not present

## 2022-01-26 DIAGNOSIS — W19XXXA Unspecified fall, initial encounter: Secondary | ICD-10-CM | POA: Diagnosis not present

## 2022-01-26 DIAGNOSIS — I499 Cardiac arrhythmia, unspecified: Secondary | ICD-10-CM | POA: Diagnosis not present

## 2022-01-26 DIAGNOSIS — M1990 Unspecified osteoarthritis, unspecified site: Secondary | ICD-10-CM | POA: Diagnosis not present

## 2022-01-26 DIAGNOSIS — R0902 Hypoxemia: Secondary | ICD-10-CM | POA: Diagnosis not present

## 2022-01-26 DIAGNOSIS — E871 Hypo-osmolality and hyponatremia: Secondary | ICD-10-CM | POA: Diagnosis not present

## 2022-01-26 DIAGNOSIS — E7800 Pure hypercholesterolemia, unspecified: Secondary | ICD-10-CM | POA: Diagnosis not present

## 2022-01-26 DIAGNOSIS — I469 Cardiac arrest, cause unspecified: Secondary | ICD-10-CM | POA: Diagnosis not present

## 2022-02-10 DEATH — deceased
